# Patient Record
Sex: Male | Born: 1970 | Race: White | Hispanic: Yes | Marital: Married | Smoking: Former smoker
Health system: Southern US, Community
[De-identification: ages and names within clinical notes are randomized; demographics above are authoritative.]

## PROBLEM LIST (undated history)

## (undated) DIAGNOSIS — M069 Rheumatoid arthritis, unspecified: Secondary | ICD-10-CM

## (undated) DIAGNOSIS — L409 Psoriasis, unspecified: Secondary | ICD-10-CM

## (undated) HISTORY — PX: SINUS EXPLORATION: SHX5214

---

## 2013-05-24 ENCOUNTER — Emergency Department (HOSPITAL_COMMUNITY): Payer: PRIVATE HEALTH INSURANCE

## 2013-05-24 ENCOUNTER — Inpatient Hospital Stay (HOSPITAL_COMMUNITY)
Admission: EM | Admit: 2013-05-24 | Discharge: 2013-06-04 | DRG: 336 | Disposition: A | Discharge status: Home / Self Care | Payer: PRIVATE HEALTH INSURANCE | Attending: General Surgery | Admitting: General Surgery

## 2013-05-24 ENCOUNTER — Encounter (HOSPITAL_COMMUNITY): Payer: Self-pay | Admitting: Emergency Medicine

## 2013-05-24 ENCOUNTER — Observation Stay (HOSPITAL_COMMUNITY): Payer: PRIVATE HEALTH INSURANCE

## 2013-05-24 DIAGNOSIS — K560 Paralytic ileus: Secondary | ICD-10-CM | POA: Diagnosis not present

## 2013-05-24 DIAGNOSIS — E876 Hypokalemia: Secondary | ICD-10-CM | POA: Diagnosis not present

## 2013-05-24 DIAGNOSIS — R143 Flatulence: Secondary | ICD-10-CM

## 2013-05-24 DIAGNOSIS — R110 Nausea: Secondary | ICD-10-CM

## 2013-05-24 DIAGNOSIS — K56609 Unspecified intestinal obstruction, unspecified as to partial versus complete obstruction: Secondary | ICD-10-CM

## 2013-05-24 DIAGNOSIS — Y849 Medical procedure, unspecified as the cause of abnormal reaction of the patient, or of later complication, without mention of misadventure at the time of the procedure: Secondary | ICD-10-CM | POA: Diagnosis not present

## 2013-05-24 DIAGNOSIS — S301XXA Contusion of abdominal wall, initial encounter: Secondary | ICD-10-CM | POA: Diagnosis not present

## 2013-05-24 DIAGNOSIS — R109 Unspecified abdominal pain: Secondary | ICD-10-CM

## 2013-05-24 DIAGNOSIS — R142 Eructation: Secondary | ICD-10-CM | POA: Diagnosis not present

## 2013-05-24 DIAGNOSIS — K565 Intestinal adhesions [bands], unspecified as to partial versus complete obstruction: Principal | ICD-10-CM | POA: Diagnosis present

## 2013-05-24 DIAGNOSIS — Z833 Family history of diabetes mellitus: Secondary | ICD-10-CM

## 2013-05-24 DIAGNOSIS — R070 Pain in throat: Secondary | ICD-10-CM | POA: Diagnosis not present

## 2013-05-24 DIAGNOSIS — Z87891 Personal history of nicotine dependence: Secondary | ICD-10-CM

## 2013-05-24 DIAGNOSIS — L408 Other psoriasis: Secondary | ICD-10-CM | POA: Diagnosis present

## 2013-05-24 DIAGNOSIS — R141 Gas pain: Secondary | ICD-10-CM | POA: Diagnosis not present

## 2013-05-24 DIAGNOSIS — K929 Disease of digestive system, unspecified: Secondary | ICD-10-CM | POA: Diagnosis not present

## 2013-05-24 DIAGNOSIS — Z5331 Laparoscopic surgical procedure converted to open procedure: Secondary | ICD-10-CM

## 2013-05-24 DIAGNOSIS — R112 Nausea with vomiting, unspecified: Secondary | ICD-10-CM | POA: Diagnosis not present

## 2013-05-24 DIAGNOSIS — D72829 Elevated white blood cell count, unspecified: Secondary | ICD-10-CM | POA: Diagnosis present

## 2013-05-24 DIAGNOSIS — R197 Diarrhea, unspecified: Secondary | ICD-10-CM | POA: Diagnosis not present

## 2013-05-24 DIAGNOSIS — M069 Rheumatoid arthritis, unspecified: Secondary | ICD-10-CM | POA: Diagnosis present

## 2013-05-24 HISTORY — DX: Psoriasis, unspecified: L40.9

## 2013-05-24 HISTORY — DX: Rheumatoid arthritis, unspecified: M06.9

## 2013-05-24 LAB — URINALYSIS, ROUTINE W REFLEX MICROSCOPIC
Bilirubin Urine: NEGATIVE
Glucose, UA: NEGATIVE mg/dL
Hgb urine dipstick: NEGATIVE
Ketones, ur: 15 mg/dL — AB
Leukocytes, UA: NEGATIVE
NITRITE: NEGATIVE
Protein, ur: NEGATIVE mg/dL
SPECIFIC GRAVITY, URINE: 1.029 (ref 1.005–1.030)
Urobilinogen, UA: 0.2 mg/dL (ref 0.0–1.0)
pH: 5 (ref 5.0–8.0)

## 2013-05-24 LAB — COMPREHENSIVE METABOLIC PANEL
ALT: 18 U/L (ref 0–53)
AST: 17 U/L (ref 0–37)
Albumin: 4.8 g/dL (ref 3.5–5.2)
Alkaline Phosphatase: 53 U/L (ref 39–117)
BUN: 17 mg/dL (ref 6–23)
CALCIUM: 10.2 mg/dL (ref 8.4–10.5)
CO2: 21 mEq/L (ref 19–32)
CREATININE: 0.7 mg/dL (ref 0.50–1.35)
Chloride: 99 mEq/L (ref 96–112)
GFR calc Af Amer: >90 mL/min (ref >90)
GLUCOSE: 157 mg/dL — AB (ref 70–99)
Potassium: 4.2 mEq/L (ref 3.7–5.3)
SODIUM: 138 meq/L (ref 137–147)
TOTAL PROTEIN: 8.2 g/dL (ref 6.0–8.3)
Total Bilirubin: 0.8 mg/dL (ref 0.3–1.2)

## 2013-05-24 LAB — CBC WITH DIFFERENTIAL/PLATELET
Basophils Absolute: 0 10*3/uL (ref 0.0–0.1)
Basophils Relative: 0 % (ref 0–1)
EOS ABS: 0 10*3/uL (ref 0.0–0.7)
EOS PCT: 0 % (ref 0–5)
HEMATOCRIT: 44 % (ref 39.0–52.0)
Hemoglobin: 16.3 g/dL (ref 13.0–17.0)
LYMPHS ABS: 2.7 10*3/uL (ref 0.7–4.0)
Lymphocytes Relative: 19 % (ref 12–46)
MCH: 31.1 pg (ref 26.0–34.0)
MCHC: 37 g/dL — ABNORMAL HIGH (ref 30.0–36.0)
MCV: 84 fL (ref 78.0–100.0)
MONO ABS: 0.9 10*3/uL (ref 0.1–1.0)
Monocytes Relative: 6 % (ref 3–12)
Neutro Abs: 10.8 10*3/uL — ABNORMAL HIGH (ref 1.7–7.7)
Neutrophils Relative %: 75 % (ref 43–77)
PLATELETS: 219 10*3/uL (ref 150–400)
RBC: 5.24 MIL/uL (ref 4.22–5.81)
RDW: 12.8 % (ref 11.5–15.5)
WBC: 14.3 10*3/uL — ABNORMAL HIGH (ref 4.0–10.5)

## 2013-05-24 LAB — LIPASE, BLOOD: Lipase: 33 U/L (ref 11–59)

## 2013-05-24 MED ORDER — BIOTENE DRY MOUTH MT LIQD
15.0000 mL | Freq: Two times a day (BID) | OROMUCOSAL | Status: DC
  Administered 2013-05-25 – 2013-05-27 (×3): 15 mL via OROMUCOSAL

## 2013-05-24 MED ORDER — HYDROMORPHONE HCL PF 1 MG/ML IJ SOLN
0.5000 mg | INTRAMUSCULAR | Status: DC | PRN
  Administered 2013-05-24 – 2013-05-25 (×6): 1 mg via INTRAVENOUS
  Filled 2013-05-24 (×6): qty 1

## 2013-05-24 MED ORDER — MORPHINE SULFATE 2 MG/ML IJ SOLN
2.0000 mg | INTRAMUSCULAR | Status: DC | PRN
  Administered 2013-05-24: 2 mg via INTRAVENOUS
  Filled 2013-05-24: qty 1

## 2013-05-24 MED ORDER — DIPHENHYDRAMINE HCL 12.5 MG/5ML PO ELIX: 12.5000 mg | ORAL_SOLUTION | Freq: Four times a day (QID) | ORAL | Status: DC | PRN

## 2013-05-24 MED ORDER — MORPHINE SULFATE 4 MG/ML IJ SOLN
4.0000 mg | Freq: Once | INTRAMUSCULAR | Status: AC
  Administered 2013-05-24: 4 mg via INTRAVENOUS
  Filled 2013-05-24: qty 1

## 2013-05-24 MED ORDER — SODIUM CHLORIDE 0.9 % IV BOLUS (SEPSIS)
1000.0000 mL | Freq: Once | INTRAVENOUS | Status: AC
  Administered 2013-05-24: 1000 mL via INTRAVENOUS

## 2013-05-24 MED ORDER — ONDANSETRON HCL 4 MG/2ML IJ SOLN
4.0000 mg | Freq: Four times a day (QID) | INTRAMUSCULAR | Status: DC | PRN
  Administered 2013-05-25 – 2013-05-31 (×8): 4 mg via INTRAVENOUS
  Filled 2013-05-24 (×7): qty 2

## 2013-05-24 MED ORDER — LIDOCAINE HCL 2 % EX GEL
Freq: Once | CUTANEOUS | Status: AC
  Administered 2013-05-24: 20
  Filled 2013-05-24: qty 20

## 2013-05-24 MED ORDER — IOHEXOL 300 MG/ML  SOLN
80.0000 mL | Freq: Once | INTRAMUSCULAR | Status: AC | PRN
  Administered 2013-05-24: 80 mL via INTRAVENOUS

## 2013-05-24 MED ORDER — CHLORHEXIDINE GLUCONATE 0.12 % MT SOLN
15.0000 mL | Freq: Two times a day (BID) | OROMUCOSAL | Status: DC
  Administered 2013-05-24 – 2013-05-28 (×9): 15 mL via OROMUCOSAL
  Filled 2013-05-24 (×9): qty 15

## 2013-05-24 MED ORDER — IOHEXOL 300 MG/ML  SOLN
25.0000 mL | Freq: Once | INTRAMUSCULAR | Status: AC | PRN
  Administered 2013-05-24: 25 mL via ORAL

## 2013-05-24 MED ORDER — HYDROMORPHONE HCL PF 1 MG/ML IJ SOLN
INTRAMUSCULAR | Status: AC
  Filled 2013-05-24: qty 1

## 2013-05-24 MED ORDER — MORPHINE SULFATE 2 MG/ML IJ SOLN: 1.0000 mg | INTRAMUSCULAR | Status: DC | PRN

## 2013-05-24 MED ORDER — MORPHINE SULFATE 2 MG/ML IJ SOLN
1.0000 mg | INTRAMUSCULAR | Status: DC | PRN
  Administered 2013-05-24: 4 mg via INTRAVENOUS
  Filled 2013-05-24: qty 2

## 2013-05-24 MED ORDER — ONDANSETRON HCL 4 MG/2ML IJ SOLN
4.0000 mg | Freq: Once | INTRAMUSCULAR | Status: AC
  Administered 2013-05-24: 4 mg via INTRAVENOUS
  Filled 2013-05-24: qty 2

## 2013-05-24 MED ORDER — ENOXAPARIN SODIUM 40 MG/0.4ML ~~LOC~~ SOLN
40.0000 mg | SUBCUTANEOUS | Status: DC
  Administered 2013-05-24 – 2013-06-03 (×11): 40 mg via SUBCUTANEOUS
  Filled 2013-05-24 (×16): qty 0.4

## 2013-05-24 MED ORDER — DIPHENHYDRAMINE HCL 50 MG/ML IJ SOLN: 12.5000 mg | Freq: Four times a day (QID) | INTRAMUSCULAR | Status: DC | PRN

## 2013-05-24 MED ORDER — KCL IN DEXTROSE-NACL 20-5-0.9 MEQ/L-%-% IV SOLN
INTRAVENOUS | Status: DC
  Administered 2013-05-24: 21:00:00 via INTRAVENOUS
  Administered 2013-05-24: 125 mL/h via INTRAVENOUS
  Administered 2013-05-25 – 2013-06-02 (×16): via INTRAVENOUS
  Filled 2013-05-24 (×34): qty 1000

## 2013-05-24 NOTE — ED Notes (Signed)
Pt more comfortable, HR and RR improved after voiding, Meghan, PA witg CCS updated, pt's NG tube backed out 2 cm per PA

## 2013-05-24 NOTE — H&P (Signed)
Nontender but no flatus yet. Pt lives in [place]. He asked about going back there if he needs surgery. I told him we will see how his films look in the AM. Patient examined and I agree with the assessment and plan  Violeta Gelinas, MD, MPH, FACS Trauma: [phone] General Surgery: [phone]  05/24/2013 5:12 PM

## 2013-05-24 NOTE — ED Notes (Signed)
Pt in increased pain, diaphoretic, elevated HR and resp rate, surgery re-paged to update

## 2013-05-24 NOTE — ED Notes (Signed)
Delay in pt being seen by physician ie: pt came to ED for evaluation for lower abdominal pain. While waiting to be seen, pain improved, he decided not to be seen and fell asleep in the chair in the ED.  He was suddenly awakened by severe lower abdominal pain, changed his mind and asked to be seen after all.  Pt reports he ate some spicy meat last evening, drank a beer, went back to his hotel and experienced lower abdominal pain.  Denies vomiting although he states he tried to do so; has had 3 episodes of diarrhea, last being approx 30 min ago.

## 2013-05-24 NOTE — ED Notes (Signed)
Pt. did not leave ER decided to be examined by EDP .

## 2013-05-24 NOTE — H&P (Signed)
Chief Complaint: abdominal pain  HPI: Jerome Sloan is a 43 year old male with a history of psoriasis who presents to [facility] with abdominal pain.  Duration of symptoms; since last night.  He reports having a steak with lots of seasoning and feeling full.  He went up to his hotel room to rest, but the pain persisted.  Modifying factors include; tums without any relief.  Aggravating factors; walking.  Alleviating factors; curled up in fetal position or bending forward.  Location of pain is in the epigastric region and without radiation.  Characterized as sharp and cramping pain.  Moderate to severe in severity.  Associated with nausea, no vomiting.  He reports coming to the ED and feeling better, even falling asleep only for the pain to return and at that time it had worsened.  He denies previous symptoms.  He endorses to traveling on weekly basis.  He reports being in [place] last week, did not have any GI symptoms.  He reports having a bowel movement overnight in the ED.  No change in bowel pattern.  Denies fever or chills.  He denies previous surgeries. AXR revealed a small bowel obstruction.  He has an NGT in place, no output thus far.  He reports that overall he feels better.  His electrolytes are, renal function and hepatic function is normal. He has a white count of 14.3K.  He is afebrile.  His vital signs are stable.    Past Medical History  Diagnosis Date  . Psoriasis     History reviewed. No pertinent past surgical history.  Family History  Problem Relation Age of Onset  . Alcohol abuse Father   . Diabetes Father    Social History:  reports that he has never smoked. He does not have any smokeless tobacco history on file. He reports that he drinks alcohol. He reports that he does not use illicit drugs.  Allergies: No Known Allergies  Medication Humira 68m SQ once weekly--due on Sunday ambien 58mQHS  (Not in a hospital admission)  Results for orders placed during the hospital  encounter of 05/24/13 (from the past 48 hour(s))  CBC WITH DIFFERENTIAL     Status: Abnormal   Collection Time    05/24/13  6:26 AM      Result Value Ref Range   WBC 14.3 (*) 4.0 - 10.5 K/uL   RBC 5.24  4.22 - 5.81 MIL/uL   Hemoglobin 16.3  13.0 - 17.0 g/dL   HCT 44.0  39.0 - 52.0 %   MCV 84.0  78.0 - 100.0 fL   MCH 31.1  26.0 - 34.0 pg   MCHC 37.0 (*) 30.0 - 36.0 g/dL   RDW 12.8  11.5 - 15.5 %   Platelets 219  150 - 400 K/uL   Neutrophils Relative % 75  43 - 77 %   Neutro Abs 10.8 (*) 1.7 - 7.7 K/uL   Lymphocytes Relative 19  12 - 46 %   Lymphs Abs 2.7  0.7 - 4.0 K/uL   Monocytes Relative 6  3 - 12 %   Monocytes Absolute 0.9  0.1 - 1.0 K/uL   Eosinophils Relative 0  0 - 5 %   Eosinophils Absolute 0.0  0.0 - 0.7 K/uL   Basophils Relative 0  0 - 1 %   Basophils Absolute 0.0  0.0 - 0.1 K/uL  COMPREHENSIVE METABOLIC PANEL     Status: Abnormal   Collection Time    05/24/13  6:26 AM  Result Value Ref Range   Sodium 138  137 - 147 mEq/L   Potassium 4.2  3.7 - 5.3 mEq/L   Chloride 99  96 - 112 mEq/L   CO2 21  19 - 32 mEq/L   Glucose, Bld 157 (*) 70 - 99 mg/dL   BUN 17  6 - 23 mg/dL   Creatinine, Ser 0.70  0.50 - 1.35 mg/dL   Calcium 10.2  8.4 - 10.5 mg/dL   Total Protein 8.2  6.0 - 8.3 g/dL   Albumin 4.8  3.5 - 5.2 g/dL   AST 17  0 - 37 U/L   ALT 18  0 - 53 U/L   Alkaline Phosphatase 53  39 - 117 U/L   Total Bilirubin 0.8  0.3 - 1.2 mg/dL   GFR calc non Af Amer >90  >90 mL/min   GFR calc Af Amer >90  >90 mL/min   Comment: (NOTE)     The eGFR has been calculated using the CKD EPI equation.     This calculation has not been validated in all clinical situations.     eGFR's persistently <90 mL/min signify possible Chronic Kidney     Disease.  LIPASE, BLOOD     Status: None   Collection Time    05/24/13  6:26 AM      Result Value Ref Range   Lipase 33  11 - 59 U/L   Dg Abd Acute W/chest  05/24/2013   CLINICAL DATA:  Mid abdominal pain.  EXAM: ACUTE ABDOMEN SERIES  (ABDOMEN 2 VIEW & CHEST 1 VIEW)  COMPARISON:  None.  FINDINGS: Low lung volumes. No infiltrates or nodules. Normal cardiomediastinal silhouette. Negative osseous structures. No free air.  Dilated loops of small bowel with air-fluid levels consistent with small bowel obstruction. Paucity of colonic gas. No calcifications or osseous abnormalities.  IMPRESSION: Negative chest radiograph.  Small bowel obstruction.   Electronically Signed   By: Rolla Flatten M.D.   On: 05/24/2013 07:28    Review of Systems  All other systems reviewed and are negative.    Blood pressure 146/93, pulse 78, temperature 98 F (36.7 C), temperature source Oral, resp. rate 14, height _0  (1.803 m), weight 185 lb (83.915 kg), SpO2 98.00%. Physical Exam  Constitutional: He is oriented to person, place, and time. He appears well-developed and well-nourished. No distress.  Eyes: Conjunctivae and EOM are normal. Pupils are equal, round, and reactive to light. No scleral icterus.  Neck: Normal range of motion. Neck supple.  Cardiovascular: Normal rate, regular rhythm, normal heart sounds and intact distal pulses.  Exam reveals no gallop and no friction rub.   No murmur heard. Respiratory: Effort normal and breath sounds normal. No respiratory distress. He has no wheezes. He has no rales. He exhibits no tenderness.  GI: Soft. Bowel sounds are normal. He exhibits distension. He exhibits no mass. There is no rebound and no guarding.  TTP to lower abdomen without guarding  Musculoskeletal: He exhibits no edema and no tenderness.  Lymphadenopathy:    He has no cervical adenopathy.  Neurological: He is alert and oriented to person, place, and time.  Skin: Skin is warm and dry. No rash noted. He is not diaphoretic. No pallor.  Psychiatric: He has a normal mood and affect. His behavior is normal. Judgment and thought content normal.     Assessment/Plan Abdominal pain, nausea Small bowel obstruction on AXR  I was unable to  appreciate a mass or hernia on exam.  He does not have any previous abdominal surgeries and therefore the risk of adhesions is low.  He does not have an acute abdomen that warrants immediate surgical intervention.  Will proceed with a CT of abdomen and pelvis to better evaluate the etiology of abdomen pain.   NGT to LWIS NPO x ice chips Pain control Antiemetics SCDs, lovenox   Donnalyn Juran ANP-BC Pager [phone] 05/24/2013, 9:27 AM

## 2013-05-24 NOTE — ED Notes (Signed)
Admitting MD at bedside.

## 2013-05-24 NOTE — ED Notes (Signed)
Pt. reports mid abdominal pain after eating supper last night with nausea .

## 2013-05-24 NOTE — ED Provider Notes (Signed)
CSN: [id number]     Arrival date & time 05/24/13  0407 History   First MD Initiated Contact with Patient 05/24/13 [phone]     Chief Complaint  Patient presents with  . Abdominal Pain     (Consider location/radiation/quality/duration/timing/severity/associated sxs/prior Treatment) The history is provided by the patient and medical records.   This is a 43 year old male with past medical history significant for psoriasis, presenting to the ED for mid and lower abdominal pain after eating dinner last night. Patient states he ate steak, which is not unusual for him, however this time he had a prepared with blackened seasonings which is new. He endorses some associated nausea but denies vomiting.  No fevers or chills.  No diarrhea. Patient does not have bowel movements are usually regular, but have been altered lately due to his travel scheduled.  He is currently in [place] visiting from [place], [place] and is scheduled to return home on Friday.  No prior abdominal surgeries.  He has taken tums and motrin without significant improvement of symptoms.  VS stable on arrival.  Past Medical History  Diagnosis Date  . Psoriasis    History reviewed. No pertinent past surgical history. No family history on file. History  Substance Use Topics  . Smoking status: Never Smoker   . Smokeless tobacco: Not on file  . Alcohol Use: Yes    Review of Systems  Gastrointestinal: Positive for nausea and abdominal pain.  All other systems reviewed and are negative.      Allergies  Review of patient's allergies indicates no known allergies.  Home Medications   Current Outpatient Rx  Name  Route  Sig  Dispense  Refill  . adalimumab (HUMIRA) 40 MG/0.8ML injection   Subcutaneous   Inject 40 mg into the skin once a week. sunday         . calcipotriene-betamethasone (TACLONEX) ointment   Topical   Apply 1 application topically daily.         . calcium carbonate (TUMS - DOSED IN MG ELEMENTAL  CALCIUM) 500 MG chewable tablet   Oral   Chew 3 tablets by mouth once.         Marland Kitchen ibuprofen (ADVIL,MOTRIN) 200 MG tablet   Oral   Take 600 mg by mouth every 6 (six) hours as needed for headache or mild pain.         Marland Kitchen zolpidem (AMBIEN) 5 MG tablet   Oral   Take 5 mg by mouth at bedtime.          BP 129/92  Pulse 82  Temp(Src) 98 F (36.7 C) (Oral)  Resp 14  Ht 5\' 11"  (1.803 m)  Wt 185 lb (83.915 kg)  BMI 25.81 kg/m2  SpO2 97%  Physical Exam  Nursing note and vitals reviewed. Constitutional: He is oriented to person, place, and time. He appears well-developed and well-nourished. No distress.  Appears uncomfortable  HENT:  Head: Normocephalic and atraumatic.  Mouth/Throat: Oropharynx is clear and moist.  Eyes: Conjunctivae and EOM are normal. Pupils are equal, round, and reactive to light.  Neck: Normal range of motion. Neck supple.  Cardiovascular: Normal rate, regular rhythm and normal heart sounds.   Pulmonary/Chest: Effort normal and breath sounds normal. No respiratory distress. He has no wheezes.  Abdominal: Soft. Bowel sounds are normal. There is tenderness in the right lower quadrant, periumbilical area and left lower quadrant. There is no rigidity, no guarding and no CVA tenderness.  Abdomen soft, non-distended, TTP periumbilical region,  LLQ, and RLQ without rebound or gurading  Musculoskeletal: Normal range of motion. He exhibits no edema.  Neurological: He is alert and oriented to person, place, and time.  Skin: Skin is warm and dry. He is not diaphoretic.  Psychiatric: He has a normal mood and affect.    ED Course  Procedures (including critical care time) Labs Review Labs Reviewed  CBC WITH DIFFERENTIAL - Abnormal; Notable for the following:    WBC 14.3 (*)    MCHC 37.0 (*)    Neutro Abs 10.8 (*)    All other components within normal limits  COMPREHENSIVE METABOLIC PANEL - Abnormal; Notable for the following:    Glucose, Bld 157 (*)    All other  components within normal limits  LIPASE, BLOOD  URINALYSIS, ROUTINE W REFLEX MICROSCOPIC   Imaging Review Dg Abd Acute W/chest  05/24/2013   CLINICAL DATA:  Mid abdominal pain.  EXAM: ACUTE ABDOMEN SERIES (ABDOMEN 2 VIEW & CHEST 1 VIEW)  COMPARISON:  None.  FINDINGS: Low lung volumes. No infiltrates or nodules. Normal cardiomediastinal silhouette. Negative osseous structures. No free air.  Dilated loops of small bowel with air-fluid levels consistent with small bowel obstruction. Paucity of colonic gas. No calcifications or osseous abnormalities.  IMPRESSION: Negative chest radiograph.  Small bowel obstruction.   Electronically Signed   By: Davonna Belling M.D.   On: 05/24/2013 07:28     EKG Interpretation None      MDM   Final diagnoses:  Small bowel obstruction   Labs with leukocytosis of 14.3. Acute abdominal series revealing small bowel obstruction. NG-tube placed. Consulted general surgery, Dr. Janee Morn who will evaluate patient in the ED and likely admit.  Garlon Hatchet, PA-C 05/24/13 1219

## 2013-05-25 ENCOUNTER — Observation Stay (HOSPITAL_COMMUNITY): Payer: PRIVATE HEALTH INSURANCE

## 2013-05-25 LAB — BASIC METABOLIC PANEL
BUN: 12 mg/dL (ref 6–23)
CO2: 25 mEq/L (ref 19–32)
Calcium: 9.2 mg/dL (ref 8.4–10.5)
Chloride: 100 mEq/L (ref 96–112)
Creatinine, Ser: 0.71 mg/dL (ref 0.50–1.35)
GFR calc non Af Amer: >90 mL/min (ref >90)
Glucose, Bld: 139 mg/dL — ABNORMAL HIGH (ref 70–99)
POTASSIUM: 4 meq/L (ref 3.7–5.3)
SODIUM: 137 meq/L (ref 137–147)

## 2013-05-25 LAB — CBC
HCT: 40.9 % (ref 39.0–52.0)
HEMOGLOBIN: 14.9 g/dL (ref 13.0–17.0)
MCH: 31.4 pg (ref 26.0–34.0)
MCHC: 36.4 g/dL — ABNORMAL HIGH (ref 30.0–36.0)
MCV: 86.3 fL (ref 78.0–100.0)
PLATELETS: 199 10*3/uL (ref 150–400)
RBC: 4.74 MIL/uL (ref 4.22–5.81)
RDW: 13.1 % (ref 11.5–15.5)
WBC: 11.3 10*3/uL — ABNORMAL HIGH (ref 4.0–10.5)

## 2013-05-25 MED ORDER — HYDROMORPHONE HCL PF 1 MG/ML IJ SOLN
1.0000 mg | Freq: Once | INTRAMUSCULAR | Status: AC
  Administered 2013-05-25: 1 mg via INTRAVENOUS

## 2013-05-25 MED ORDER — LORAZEPAM BOLUS VIA INFUSION
0.5000 mg | Freq: Three times a day (TID) | INTRAVENOUS | Status: DC | PRN
  Filled 2013-05-25: qty 1

## 2013-05-25 MED ORDER — LORAZEPAM 2 MG/ML IJ SOLN
0.5000 mg | Freq: Four times a day (QID) | INTRAMUSCULAR | Status: DC | PRN
  Administered 2013-05-25 – 2013-05-30 (×8): 1 mg via INTRAVENOUS
  Filled 2013-05-25 (×9): qty 1

## 2013-05-25 MED ORDER — HYDROMORPHONE HCL PF 1 MG/ML IJ SOLN
1.0000 mg | INTRAMUSCULAR | Status: DC | PRN
  Administered 2013-05-25 – 2013-05-28 (×17): 1 mg via INTRAVENOUS
  Filled 2013-05-25 (×17): qty 1

## 2013-05-25 NOTE — Progress Notes (Signed)
No real improvement. Has had some pain but abdomen is soft and NT. NGT repositioned & irrigated and some lettuce is coming back.If no improvement tomorrow, will likely need surgery. His wife is flying in tonight.  Patient examined and I agree with the assessment and plan  Violeta Gelinas, MD, MPH, FACS Trauma: [phone] General Surgery: [phone]  05/25/2013 2:23 PM

## 2013-05-25 NOTE — Progress Notes (Signed)
UR completed. Patient changed to inpatient- requiring IVF @ 125cc/hr and IV pain medication 

## 2013-05-25 NOTE — Progress Notes (Signed)
Patient ID: Jerome Sloan, male   DOB: [birth year], 43 y.o.   MRN: [id number]  Subjective: Had pain earlier, cramping type, intermittent.  No n/v.  No flatus or BM.  No output recorded, but NG cannister with 500 of bilious output.    Objective:  Vital signs:  Filed Vitals:   05/24/13 1820 05/24/13 2111 05/25/13 0201 05/25/13 0619  BP: 135/96 144/103 145/94 141/99  Pulse: 74 80 81 85  Temp: 98.5 F (36.9 C) 97.8 F (36.6 C) 98.4 F (36.9 C) 97.5 F (36.4 C)  TempSrc: Oral Oral Oral Oral  Resp: $Remo'18 18 18 18  'WdosU$ Height:      Weight:      SpO2: 100% 96% 96% 83%    Last BM Date: 05/24/13  Intake/Output   Yesterday:  03/25 0701 - 03/26 0700 In: -  Out: 925 [Urine:925] This shift:    I/O last 3 completed shifts: In: 1600 [I.V.:1250; NG/GT:350] Out: 925 [Urine:925]   Physical Exam: General: Pt awake/alert/oriented x3 in no acute distress Chest: cta. No chest wall pain w good excursion CV:  Pulses intact.  Regular rhythm MS: Normal AROM mjr joints.  No obvious deformity Abdomen: Soft.  Nondistended. Minimal ttp to rlq.  No evidence of peritonitis.  No incarcerated hernias. Ext:  SCDs BLE.  No mjr edema.  No cyanosis Skin: No petechiae / purpura   Problem List:   Active Problems:   Abdominal pain    Results:   Labs: Results for orders placed during the hospital encounter of 05/24/13 (from the past 48 hour(s))  CBC WITH DIFFERENTIAL     Status: Abnormal   Collection Time    05/24/13  6:26 AM      Result Value Ref Range   WBC 14.3 (*) 4.0 - 10.5 K/uL   RBC 5.24  4.22 - 5.81 MIL/uL   Hemoglobin 16.3  13.0 - 17.0 g/dL   HCT 44.0  39.0 - 52.0 %   MCV 84.0  78.0 - 100.0 fL   MCH 31.1  26.0 - 34.0 pg   MCHC 37.0 (*) 30.0 - 36.0 g/dL   RDW 12.8  11.5 - 15.5 %   Platelets 219  150 - 400 K/uL   Neutrophils Relative % 75  43 - 77 %   Neutro Abs 10.8 (*) 1.7 - 7.7 K/uL   Lymphocytes Relative 19  12 - 46 %   Lymphs Abs 2.7  0.7 - 4.0 K/uL   Monocytes Relative 6  3 -  12 %   Monocytes Absolute 0.9  0.1 - 1.0 K/uL   Eosinophils Relative 0  0 - 5 %   Eosinophils Absolute 0.0  0.0 - 0.7 K/uL   Basophils Relative 0  0 - 1 %   Basophils Absolute 0.0  0.0 - 0.1 K/uL  COMPREHENSIVE METABOLIC PANEL     Status: Abnormal   Collection Time    05/24/13  6:26 AM      Result Value Ref Range   Sodium 138  137 - 147 mEq/L   Potassium 4.2  3.7 - 5.3 mEq/L   Chloride 99  96 - 112 mEq/L   CO2 21  19 - 32 mEq/L   Glucose, Bld 157 (*) 70 - 99 mg/dL   BUN 17  6 - 23 mg/dL   Creatinine, Ser 0.70  0.50 - 1.35 mg/dL   Calcium 10.2  8.4 - 10.5 mg/dL   Total Protein 8.2  6.0 - 8.3 g/dL   Albumin 4.8  3.5 - 5.2 g/dL   AST 17  0 - 37 U/L   ALT 18  0 - 53 U/L   Alkaline Phosphatase 53  39 - 117 U/L   Total Bilirubin 0.8  0.3 - 1.2 mg/dL   GFR calc non Af Amer >90  >90 mL/min   GFR calc Af Amer >90  >90 mL/min   Comment: (NOTE)     The eGFR has been calculated using the CKD EPI equation.     This calculation has not been validated in all clinical situations.     eGFR's persistently <90 mL/min signify possible Chronic Kidney     Disease.  LIPASE, BLOOD     Status: None   Collection Time    05/24/13  6:26 AM      Result Value Ref Range   Lipase 33  11 - 59 U/L  URINALYSIS, ROUTINE W REFLEX MICROSCOPIC     Status: Abnormal   Collection Time    05/24/13  9:58 AM      Result Value Ref Range   Color, Urine YELLOW  YELLOW   APPearance CLEAR  CLEAR   Specific Gravity, Urine 1.029  1.005 - 1.030   pH 5.0  5.0 - 8.0   Glucose, UA NEGATIVE  NEGATIVE mg/dL   Hgb urine dipstick NEGATIVE  NEGATIVE   Bilirubin Urine NEGATIVE  NEGATIVE   Ketones, ur 15 (*) NEGATIVE mg/dL   Protein, ur NEGATIVE  NEGATIVE mg/dL   Urobilinogen, UA 0.2  0.0 - 1.0 mg/dL   Nitrite NEGATIVE  NEGATIVE   Leukocytes, UA NEGATIVE  NEGATIVE   Comment: MICROSCOPIC NOT DONE ON URINES WITH NEGATIVE PROTEIN, BLOOD, LEUKOCYTES, NITRITE, OR GLUCOSE <1000 mg/dL.  CBC     Status: Abnormal   Collection Time     05/25/13  5:52 AM      Result Value Ref Range   WBC 11.3 (*) 4.0 - 10.5 K/uL   RBC 4.74  4.22 - 5.81 MIL/uL   Hemoglobin 14.9  13.0 - 17.0 g/dL   HCT 40.9  39.0 - 52.0 %   MCV 86.3  78.0 - 100.0 fL   MCH 31.4  26.0 - 34.0 pg   MCHC 36.4 (*) 30.0 - 36.0 g/dL   RDW 13.1  11.5 - 15.5 %   Platelets 199  150 - 400 K/uL  BASIC METABOLIC PANEL     Status: Abnormal   Collection Time    05/25/13  5:52 AM      Result Value Ref Range   Sodium 137  137 - 147 mEq/L   Potassium 4.0  3.7 - 5.3 mEq/L   Chloride 100  96 - 112 mEq/L   CO2 25  19 - 32 mEq/L   Glucose, Bld 139 (*) 70 - 99 mg/dL   BUN 12  6 - 23 mg/dL   Creatinine, Ser 0.71  0.50 - 1.35 mg/dL   Calcium 9.2  8.4 - 10.5 mg/dL   GFR calc non Af Amer >90  >90 mL/min   GFR calc Af Amer >90  >90 mL/min   Comment: (NOTE)     The eGFR has been calculated using the CKD EPI equation.     This calculation has not been validated in all clinical situations.     eGFR's persistently <90 mL/min signify possible Chronic Kidney     Disease.    Imaging / Studies: Ct Abdomen Pelvis W Contrast  05/24/2013   CLINICAL DATA:  Pain.  EXAM: CT ABDOMEN  AND PELVIS WITH CONTRAST  TECHNIQUE: Multidetector CT imaging of the abdomen and pelvis was performed using the standard protocol following bolus administration of intravenous contrast.  CONTRAST:  32mL OMNIPAQUE IOHEXOL 300 MG/ML  SOLN  COMPARISON:  DG ABD ACUTE W/CHEST dated 05/24/2013  FINDINGS: Punctate lucency is seen in the right hepatic lobe consistent with tiny cyst. Liver otherwise normal. Spleen normal. Splenosis. Pancreas is normal. No biliary distention. Gallbladder is nondistended.  Adrenals normal. Tiny angiomyolipoma right kidney. Pelvic kidney on the left. No hydronephrosis or evidence of obstructing ureteral stone. The bladder is nondistended.  No significant adenopathy. Abdominal aorta normal in caliber. Visceral vessels are patent. Portal vein patent.  The appendix is not definitely identified,  but what appears to be the appendix is normal. The colon is nondistended. Multiple distended loops of small bowel are noted. Loops are distended to the level of the pelvis. Mesenteric edema is present. These findings are consistent with small bowel obstruction. The etiology of small bowel obstruction is not determined. NG tube is noted coiled in the stomach. There is mild gastric distention. No free air. No hernia.  Lung bases clear.  No acute bony abnormality.  Heart size normal.  IMPRESSION: 1. Findings most consistent with small bowel obstruction at the level of the mid pelvis. Etiology of the obstruction is unclear. 2. NG tube noted coiled in stomach. Debris and fluid noted in the stomach. 3. Left pelvic kidney.   Electronically Signed   By: Marcello Moores  Register   On: 05/24/2013 11:40   Dg Abd Acute W/chest  05/24/2013   CLINICAL DATA:  Mid abdominal pain.  EXAM: ACUTE ABDOMEN SERIES (ABDOMEN 2 VIEW & CHEST 1 VIEW)  COMPARISON:  None.  FINDINGS: Low lung volumes. No infiltrates or nodules. Normal cardiomediastinal silhouette. Negative osseous structures. No free air.  Dilated loops of small bowel with air-fluid levels consistent with small bowel obstruction. Paucity of colonic gas. No calcifications or osseous abnormalities.  IMPRESSION: Negative chest radiograph.  Small bowel obstruction.   Electronically Signed   By: Rolla Flatten M.D.   On: 05/24/2013 07:28    Medications / Allergies: per chart  Antibiotics: Anti-infectives   None      Assessment/Plan Abdominal pain, nausea  Small bowel obstruction No improvement in AXR this AM, continue with conservative management.  The patient would like to avoid surgery and get back to [facility].  Hopefully he is improve with conservative management.  NGT to LWIS  NPO x ice chips  Pain control  Antiemetics  SCDs, lovenox Repeat films AM    Erby Pian, [facility] Surgery Pager [phone] Office [phone]  05/25/2013 8:27  AM

## 2013-05-26 ENCOUNTER — Encounter (HOSPITAL_COMMUNITY): Payer: PRIVATE HEALTH INSURANCE | Admitting: Certified Registered"

## 2013-05-26 ENCOUNTER — Inpatient Hospital Stay (HOSPITAL_COMMUNITY): Payer: PRIVATE HEALTH INSURANCE

## 2013-05-26 ENCOUNTER — Encounter (HOSPITAL_COMMUNITY): Payer: Self-pay | Admitting: Certified Registered"

## 2013-05-26 ENCOUNTER — Inpatient Hospital Stay (HOSPITAL_COMMUNITY): Payer: PRIVATE HEALTH INSURANCE | Admitting: Certified Registered"

## 2013-05-26 ENCOUNTER — Encounter (HOSPITAL_COMMUNITY): Admission: EM | Disposition: A | Discharge status: Home / Self Care | Payer: Self-pay | Source: Home / Self Care

## 2013-05-26 DIAGNOSIS — K565 Intestinal adhesions [bands], unspecified as to partial versus complete obstruction: Secondary | ICD-10-CM

## 2013-05-26 HISTORY — PX: LAPAROTOMY: SHX154

## 2013-05-26 HISTORY — PX: LAPAROSCOPY: SHX197

## 2013-05-26 LAB — SURGICAL PCR SCREEN
MRSA, PCR: NEGATIVE
Staphylococcus aureus: NEGATIVE

## 2013-05-26 SURGERY — LAPAROSCOPY, DIAGNOSTIC
Anesthesia: General | Site: Abdomen

## 2013-05-26 MED ORDER — OXYCODONE HCL 5 MG PO TABS: 5.0000 mg | ORAL_TABLET | Freq: Once | ORAL | Status: DC | PRN

## 2013-05-26 MED ORDER — LACTATED RINGERS IV SOLN
INTRAVENOUS | Status: DC
  Administered 2013-05-26 (×2): via INTRAVENOUS

## 2013-05-26 MED ORDER — SODIUM CHLORIDE 0.9 % IJ SOLN: 9.0000 mL | INTRAMUSCULAR | Status: DC | PRN

## 2013-05-26 MED ORDER — NEOSTIGMINE METHYLSULFATE 1 MG/ML IJ SOLN
INTRAMUSCULAR | Status: DC | PRN
  Administered 2013-05-26: 4 mg via INTRAVENOUS

## 2013-05-26 MED ORDER — ROCURONIUM BROMIDE 100 MG/10ML IV SOLN
INTRAVENOUS | Status: DC | PRN
  Administered 2013-05-26: 10 mg via INTRAVENOUS
  Administered 2013-05-26: 25 mg via INTRAVENOUS
  Administered 2013-05-26: 15 mg via INTRAVENOUS

## 2013-05-26 MED ORDER — PROPOFOL 10 MG/ML IV BOLUS
INTRAVENOUS | Status: DC | PRN
  Administered 2013-05-26: 40 mg via INTRAVENOUS
  Administered 2013-05-26: 160 mg via INTRAVENOUS

## 2013-05-26 MED ORDER — MIDAZOLAM HCL 5 MG/5ML IJ SOLN
INTRAMUSCULAR | Status: DC | PRN
  Administered 2013-05-26: 2 mg via INTRAVENOUS

## 2013-05-26 MED ORDER — SUFENTANIL CITRATE 50 MCG/ML IV SOLN
INTRAVENOUS | Status: DC | PRN
  Administered 2013-05-26: 15 ug via INTRAVENOUS
  Administered 2013-05-26: 10 ug via INTRAVENOUS
  Administered 2013-05-26: 5 ug via INTRAVENOUS
  Administered 2013-05-26: 10 ug via INTRAVENOUS
  Administered 2013-05-26 (×2): 5 ug via INTRAVENOUS

## 2013-05-26 MED ORDER — PROPOFOL 10 MG/ML IV BOLUS
INTRAVENOUS | Status: AC
  Filled 2013-05-26: qty 20

## 2013-05-26 MED ORDER — HYDROMORPHONE 0.3 MG/ML IV SOLN
INTRAVENOUS | Status: DC
  Administered 2013-05-27: 0.9 mg via INTRAVENOUS
  Administered 2013-05-27: 0.6 mg via INTRAVENOUS
  Administered 2013-05-27: 09:00:00 via INTRAVENOUS
  Administered 2013-05-27: 0.9 mg via INTRAVENOUS
  Administered 2013-05-27: 0.6 mg via INTRAVENOUS
  Filled 2013-05-26: qty 25

## 2013-05-26 MED ORDER — ONDANSETRON HCL 4 MG/2ML IJ SOLN: 4.0000 mg | Freq: Once | INTRAMUSCULAR | Status: DC | PRN

## 2013-05-26 MED ORDER — 0.9 % SODIUM CHLORIDE (POUR BTL) OPTIME
TOPICAL | Status: DC | PRN
  Administered 2013-05-26 (×2): 1000 mL

## 2013-05-26 MED ORDER — ONDANSETRON HCL 4 MG/2ML IJ SOLN
INTRAMUSCULAR | Status: DC | PRN
  Administered 2013-05-26: 4 mg via INTRAVENOUS

## 2013-05-26 MED ORDER — HYDROMORPHONE HCL PF 1 MG/ML IJ SOLN
INTRAMUSCULAR | Status: AC
  Administered 2013-05-26: 0.5 mg via INTRAVENOUS
  Filled 2013-05-26: qty 2

## 2013-05-26 MED ORDER — ROCURONIUM BROMIDE 50 MG/5ML IV SOLN
INTRAVENOUS | Status: AC
  Filled 2013-05-26: qty 1

## 2013-05-26 MED ORDER — LIDOCAINE HCL (CARDIAC) 20 MG/ML IV SOLN
INTRAVENOUS | Status: AC
  Filled 2013-05-26: qty 5

## 2013-05-26 MED ORDER — DIPHENHYDRAMINE HCL 12.5 MG/5ML PO ELIX: 12.5000 mg | ORAL_SOLUTION | Freq: Four times a day (QID) | ORAL | Status: DC | PRN

## 2013-05-26 MED ORDER — SUCCINYLCHOLINE CHLORIDE 20 MG/ML IJ SOLN
INTRAMUSCULAR | Status: AC
  Filled 2013-05-26: qty 1

## 2013-05-26 MED ORDER — GLYCOPYRROLATE 0.2 MG/ML IJ SOLN
INTRAMUSCULAR | Status: AC
  Filled 2013-05-26: qty 3

## 2013-05-26 MED ORDER — ONDANSETRON HCL 4 MG/2ML IJ SOLN
INTRAMUSCULAR | Status: AC
  Filled 2013-05-26: qty 2

## 2013-05-26 MED ORDER — ACETAMINOPHEN 325 MG PO TABS: 325.0000 mg | ORAL_TABLET | ORAL | Status: DC | PRN

## 2013-05-26 MED ORDER — GLYCOPYRROLATE 0.2 MG/ML IJ SOLN
INTRAMUSCULAR | Status: DC | PRN
  Administered 2013-05-26: 0.6 mg via INTRAVENOUS

## 2013-05-26 MED ORDER — NALOXONE HCL 0.4 MG/ML IJ SOLN: 0.4000 mg | INTRAMUSCULAR | Status: DC | PRN

## 2013-05-26 MED ORDER — MIDAZOLAM HCL 2 MG/2ML IJ SOLN
INTRAMUSCULAR | Status: AC
  Filled 2013-05-26: qty 2

## 2013-05-26 MED ORDER — DIPHENHYDRAMINE HCL 50 MG/ML IJ SOLN: 12.5000 mg | Freq: Four times a day (QID) | INTRAMUSCULAR | Status: DC | PRN

## 2013-05-26 MED ORDER — BUPIVACAINE-EPINEPHRINE PF 0.25-1:200000 % IJ SOLN
INTRAMUSCULAR | Status: DC | PRN
  Administered 2013-05-26: 30 mL

## 2013-05-26 MED ORDER — SODIUM CHLORIDE 0.9 % IJ SOLN
INTRAMUSCULAR | Status: AC
  Filled 2013-05-26: qty 10

## 2013-05-26 MED ORDER — ONDANSETRON HCL 4 MG/2ML IJ SOLN
4.0000 mg | Freq: Four times a day (QID) | INTRAMUSCULAR | Status: DC | PRN
Start: 2013-05-26 — End: 2013-05-26

## 2013-05-26 MED ORDER — DEXTROSE 5 % IV SOLN
1.0000 g | Freq: Once | INTRAVENOUS | Status: AC
  Administered 2013-05-26: 1 g via INTRAVENOUS
  Filled 2013-05-26 (×2): qty 1

## 2013-05-26 MED ORDER — SUCCINYLCHOLINE CHLORIDE 20 MG/ML IJ SOLN
INTRAMUSCULAR | Status: DC | PRN
  Administered 2013-05-26: 50 mg via INTRAVENOUS

## 2013-05-26 MED ORDER — ARTIFICIAL TEARS OP OINT
TOPICAL_OINTMENT | OPHTHALMIC | Status: AC
  Filled 2013-05-26: qty 3.5

## 2013-05-26 MED ORDER — BUPIVACAINE-EPINEPHRINE (PF) 0.25% -1:200000 IJ SOLN
INTRAMUSCULAR | Status: AC
  Filled 2013-05-26: qty 30

## 2013-05-26 MED ORDER — HYDROMORPHONE HCL PF 1 MG/ML IJ SOLN
0.2500 mg | INTRAMUSCULAR | Status: DC | PRN
  Administered 2013-05-26 (×4): 0.5 mg via INTRAVENOUS

## 2013-05-26 MED ORDER — LIDOCAINE HCL (CARDIAC) 20 MG/ML IV SOLN
INTRAVENOUS | Status: DC | PRN
  Administered 2013-05-26: 80 mg via INTRAVENOUS

## 2013-05-26 MED ORDER — ACETAMINOPHEN 160 MG/5ML PO SOLN
325.0000 mg | ORAL | Status: DC | PRN
  Filled 2013-05-26: qty 20.3

## 2013-05-26 MED ORDER — SUFENTANIL CITRATE 50 MCG/ML IV SOLN
INTRAVENOUS | Status: AC
  Filled 2013-05-26: qty 1

## 2013-05-26 MED ORDER — ARTIFICIAL TEARS OP OINT
TOPICAL_OINTMENT | OPHTHALMIC | Status: DC | PRN
  Administered 2013-05-26: 1 via OPHTHALMIC

## 2013-05-26 MED ORDER — ALBUMIN HUMAN 5 % IV SOLN
25.0000 g | Freq: Once | INTRAVENOUS | Status: AC
  Administered 2013-05-26: 25 g via INTRAVENOUS
  Filled 2013-05-26: qty 500

## 2013-05-26 MED ORDER — OXYCODONE HCL 5 MG/5ML PO SOLN: 5.0000 mg | Freq: Once | ORAL | Status: DC | PRN

## 2013-05-26 SURGICAL SUPPLY — 65 items
BLADE SURG 11 STRL SS (BLADE) ×3 IMPLANT
BLADE SURG ROTATE 9660 (MISCELLANEOUS) IMPLANT
CANISTER SUCTION 2500CC (MISCELLANEOUS) ×3 IMPLANT
CHLORAPREP W/TINT 26ML (MISCELLANEOUS) ×3 IMPLANT
COVER MAYO STAND STRL (DRAPES) IMPLANT
COVER SURGICAL LIGHT HANDLE (MISCELLANEOUS) ×3 IMPLANT
DRAPE CAMERA CLOSED 9X96 (DRAPES) ×3 IMPLANT
DRAPE LAPAROSCOPIC ABDOMINAL (DRAPES) ×3 IMPLANT
DRAPE PROXIMA HALF (DRAPES) IMPLANT
DRAPE UTILITY 15X26 W/TAPE STR (DRAPE) ×6 IMPLANT
DRAPE WARM FLUID 44X44 (DRAPE) ×3 IMPLANT
DRSG OPSITE POSTOP 4X10 (GAUZE/BANDAGES/DRESSINGS) IMPLANT
DRSG OPSITE POSTOP 4X8 (GAUZE/BANDAGES/DRESSINGS) IMPLANT
ELECT BLADE 6.5 EXT (BLADE) IMPLANT
ELECT CAUTERY BLADE 6.4 (BLADE) ×3 IMPLANT
ELECT REM PT RETURN 9FT ADLT (ELECTROSURGICAL) ×3
ELECTRODE REM PT RTRN 9FT ADLT (ELECTROSURGICAL) ×1 IMPLANT
FILTER SMOKE EVAC LAPAROSHD (FILTER) IMPLANT
GAUZE SPONGE 4X4 16PLY XRAY LF (GAUZE/BANDAGES/DRESSINGS) ×3 IMPLANT
GLOVE BIO SURGEON STRL SZ7.5 (GLOVE) ×3 IMPLANT
GLOVE BIO SURGEON STRL SZ8 (GLOVE) ×3 IMPLANT
GLOVE BIOGEL PI IND STRL 7.0 (GLOVE) ×2 IMPLANT
GLOVE BIOGEL PI IND STRL 7.5 (GLOVE) ×1 IMPLANT
GLOVE BIOGEL PI IND STRL 8 (GLOVE) ×1 IMPLANT
GLOVE BIOGEL PI INDICATOR 7.0 (GLOVE) ×4
GLOVE BIOGEL PI INDICATOR 7.5 (GLOVE) ×2
GLOVE BIOGEL PI INDICATOR 8 (GLOVE) ×2
GLOVE SKINSENSE NS SZ6.5 (GLOVE) ×2
GLOVE SKINSENSE STRL SZ6.5 (GLOVE) ×1 IMPLANT
GOWN STRL REUS W/ TWL LRG LVL3 (GOWN DISPOSABLE) ×1 IMPLANT
GOWN STRL REUS W/ TWL XL LVL3 (GOWN DISPOSABLE) ×1 IMPLANT
GOWN STRL REUS W/TWL LRG LVL3 (GOWN DISPOSABLE) ×2
GOWN STRL REUS W/TWL XL LVL3 (GOWN DISPOSABLE) ×2
KIT BASIN OR (CUSTOM PROCEDURE TRAY) ×3 IMPLANT
KIT ROOM TURNOVER OR (KITS) ×3 IMPLANT
LIGASURE IMPACT 36 18CM CVD LR (INSTRUMENTS) IMPLANT
NEEDLE 22X1 1/2 (OR ONLY) (NEEDLE) IMPLANT
NEEDLE HYPO 25GX1X1/2 BEV (NEEDLE) ×3 IMPLANT
NS IRRIG 1000ML POUR BTL (IV SOLUTION) ×3 IMPLANT
PACK GENERAL/GYN (CUSTOM PROCEDURE TRAY) ×3 IMPLANT
PAD ARMBOARD 7.5X6 YLW CONV (MISCELLANEOUS) ×6 IMPLANT
PENCIL BUTTON HOLSTER BLD 10FT (ELECTRODE) IMPLANT
SOLUTION ANTI FOG 6CC (MISCELLANEOUS) ×3 IMPLANT
SPECIMEN JAR LARGE (MISCELLANEOUS) IMPLANT
SPONGE GAUZE 4X4 12PLY STER LF (GAUZE/BANDAGES/DRESSINGS) ×3 IMPLANT
SPONGE LAP 18X18 X RAY DECT (DISPOSABLE) ×9 IMPLANT
STAPLER VISISTAT 35W (STAPLE) ×3 IMPLANT
SUCTION POOLE TIP (SUCTIONS) IMPLANT
SUT PDS AB 1 TP1 96 (SUTURE) ×6 IMPLANT
SUT SILK 2 0 SH CR/8 (SUTURE) ×3 IMPLANT
SUT SILK 2 0 TIES 10X30 (SUTURE) ×3 IMPLANT
SUT SILK 3 0 SH CR/8 (SUTURE) ×3 IMPLANT
SUT SILK 3 0 TIES 10X30 (SUTURE) ×3 IMPLANT
SUT VICRYL 0 UR6 27IN ABS (SUTURE) ×3 IMPLANT
SYR CONTROL 10ML LL (SYRINGE) ×3 IMPLANT
TAPE CLOTH SURG 4X10 WHT LF (GAUZE/BANDAGES/DRESSINGS) ×3 IMPLANT
TOWEL OR 17X26 10 PK STRL BLUE (TOWEL DISPOSABLE) ×3 IMPLANT
TOWEL OR NON WOVEN STRL DISP B (DISPOSABLE) ×6 IMPLANT
TRAY FOLEY CATH 16FRSI W/METER (SET/KITS/TRAYS/PACK) ×3 IMPLANT
TROCAR XCEL BLUNT TIP 100MML (ENDOMECHANICALS) ×3 IMPLANT
TROCAR XCEL NON-BLD 5MMX100MML (ENDOMECHANICALS) ×3 IMPLANT
TUBE CONNECTING 12'X1/4 (SUCTIONS) ×1
TUBE CONNECTING 12X1/4 (SUCTIONS) ×2 IMPLANT
TUBING INSUFFLATION 10FT LAP (TUBING) ×3 IMPLANT
YANKAUER SUCT BULB TIP NO VENT (SUCTIONS) IMPLANT

## 2013-05-26 NOTE — Anesthesia Procedure Notes (Signed)
Procedure Name: Intubation Date/Time: 05/26/2013 3:18 PM Performed by: De Nurse Pre-anesthesia Checklist: Patient identified, Emergency Drugs available, Suction available, Patient being monitored and Timeout performed Patient Re-evaluated:Patient Re-evaluated prior to inductionOxygen Delivery Method: Circle system utilized Preoxygenation: Pre-oxygenation with 100% oxygen Intubation Type: IV induction, Rapid sequence and Cricoid Pressure applied Laryngoscope Size: Mac and 4 Grade View: Grade III Tube type: Oral Tube size: 8.0 mm Number of attempts: 1 Airway Equipment and Method: Stylet Placement Confirmation: ETT inserted through vocal cords under direct vision,  positive ETCO2 and breath sounds checked- equal and bilateral Secured at: 23 cm Tube secured with: Tape Dental Injury: Teeth and Oropharynx as per pre-operative assessment

## 2013-05-26 NOTE — Op Note (Signed)
05/24/2013 - 05/26/2013  4:37 PM  PATIENT:  Jerome Sloan  43 y.o. male  PRE-OPERATIVE DIAGNOSIS:  small bowel obstruction  POST-OPERATIVE DIAGNOSIS:  small bowel obstruction due to adhesions  PROCEDURE:  Procedure(s): LAPAROSCOPY DIAGNOSTIC EXPLORATORY LAPAROTOMY LYSIS OF ADHESIONS 45 MINUTES  SURGEON:  Surgeon(s): Liz Malady, MD  ASSISTANTS: none   ANESTHESIA:   local and general  EBL:  Total I/O In: 2460.4 [I.V.:2460.4] Out: 475 [Emesis/NG output:100; Other:300; Blood:75]  BLOOD ADMINISTERED:none  DRAINS: none   SPECIMEN:  No Specimen  DISPOSITION OF SPECIMEN:  N/A  COUNTS:  YES  DICTATION: .Dragon Dictation Patient is brought for exploratory laparoscopy possible laparotomy for small bowel obstruction. He was identified in the preop holding area. He received intravenous antibiotics. He was brought to the operating room. General endotracheal anesthesia was administered by the anesthesia staff. His abdomen was prepped and draped in sterile fashion. We did a time out procedure. Infraumbilical region was infiltrated with local. Infraumbilical incision was made. Subcutaneous tissues were dissected down revealing the anterior fascia. This was divided along the midline. Peritoneal cavity was entered under direct vision. 0 Vicryl pursestring was placed on the fascial opening. Hassan trocar was inserted. Abdomen was insufflated with carbon dioxide in standard fashion. Laparoscopic exploration revealed  Dilated small bowel with Blood-tinged ascites and ecchymotic regions on the bowel. In light of these findings, decision was made to convert to open. Midline incision was extended above and below the infraumbilical incision site. The fascia was opened to the length of the incision. Bowel was inspected. Small bowel is dilated proximally. There were several adhesions to the omentum which were gradually taken down over 45 minutes. These included a large proximal and distal tight band  creating a partial closed-loop obstruction involving over 100 cm of small bowel. The small bowel was viable but had scattered contusions in the mesentery and wall. The adhesiolysis was completed in the small bowel was free from the ligament of Treitz to the terminal ileum. Cecum and right colon appeared normal. Next the nasogastric tube was adjusted in was only partially functional. It was replaced by anesthesia with a larger NG tube. This facilitated milking back some of the small bowel contents into the stomach for evacuation. The small bowel was again inspected. It was much pinker and more healthy looking. No areas were necrotic and no resections were needed. There was good blood flow and again the color was much improved. Bowel was replaced in anatomic position after running it one more time. Omentum was brought over the small bowel. Abdomen was irrigated with saline which returned clear. Local anesthetic was injected along the wound.Fascia was closed with 2 lengths of running #1 looped PDS from each end and tied in the middle. Skin was irrigated and closed with staples. All counts were correct. Patient tolerated the procedure well without apparent complication and was taken recovery in stable condition.  PATIENT DISPOSITION:  PACU - hemodynamically stable.   Delay start of Pharmacological VTE agent (>24hrs) due to surgical blood loss or risk of bleeding:  no  Violeta Gelinas, MD, MPH, FACS Pager: [phone]  3/27/20154:37 PM

## 2013-05-26 NOTE — Anesthesia Preprocedure Evaluation (Addendum)
Anesthesia Evaluation  Patient identified by MRN, date of birth, ID band Patient awake    Reviewed: Allergy & Precautions, H&P , NPO status , Patient's Chart, lab work & pertinent test results  History of Anesthesia Complications Negative for: history of anesthetic complications  Airway Mallampati: II TM Distance: >3 FB Neck ROM: Full    Dental  (+) Teeth Intact, Dental Advisory Given   Pulmonary neg pulmonary ROS, neg sleep apnea, neg COPDformer smoker,  breath sounds clear to auscultation        Cardiovascular - angina- Past MI and - CHF negative cardio ROS  - dysrhythmias - Valvular Problems/MurmursRhythm:Regular Rate:Normal     Neuro/Psych negative neurological ROS  negative psych ROS   GI/Hepatic Neg liver ROS, Bowel obstruction   Endo/Other  negative endocrine ROS  Renal/GU negative Renal ROS     Musculoskeletal  (+) Arthritis -, Rheumatoid disorders,    Abdominal   Peds  Hematology negative hematology ROS (+)   Anesthesia Other Findings   Reproductive/Obstetrics                          Anesthesia Physical Anesthesia Plan  ASA: II  Anesthesia Plan: General   Post-op Pain Management:    Induction: Intravenous  Airway Management Planned: Oral ETT  Additional Equipment: None  Intra-op Plan:   Post-operative Plan: Extubation in OR  Informed Consent: I have reviewed the patients History and Physical, chart, labs and discussed the procedure including the risks, benefits and alternatives for the proposed anesthesia with the patient or authorized representative who has indicated his/her understanding and acceptance.   Dental advisory given  Plan Discussed with: CRNA and Surgeon  Anesthesia Plan Comments:         Anesthesia Quick Evaluation

## 2013-05-26 NOTE — Transfer of Care (Signed)
Immediate Anesthesia Transfer of Care Note  Patient: Jerome Sloan  Procedure(s) Performed: Procedure(s): LAPAROSCOPY DIAGNOSTIC (N/A) EXPLORATORY LAPAROTOMY with Lysis of Adhesion (N/A)  Patient Location: PACU  Anesthesia Type:General  Level of Consciousness: awake, alert  and oriented  Airway & Oxygen Therapy: Patient Spontanous Breathing and Patient connected to nasal cannula oxygen  Post-op Assessment: Report given to PACU RN  Post vital signs: Reviewed and stable  Complications: No apparent anesthesia complications

## 2013-05-26 NOTE — Preoperative (Signed)
Beta Blockers   Reason not to administer Beta Blockers:Not Applicable 

## 2013-05-26 NOTE — Progress Notes (Signed)
Subjective: No flatus nor BM, abdominal pain resolved  Objective: Vital signs in last 24 hours: Temp:  [97.2 F (36.2 C)-99 F (37.2 C)] 97.4 F (36.3 C) (03/27 0648) Pulse Rate:  [83-115] 105 (03/27 0648) Resp:  [16-18] 18 (03/27 0648) BP: (137-141)/(89-109) 137/97 mmHg (03/27 0648) SpO2:  [90 %-98 %] 93 % (03/27 0648) Last BM Date: 05/24/13  Intake/Output from previous day: 03/26 0701 - 03/27 0700 In: 2250 [I.V.:2150; NG/GT:100] Out: 1301 [Urine:900; Emesis/NG output:401] Intake/Output this shift:    General appearance: alert and cooperative Resp: clear to auscultation bilaterally Cardio: regular rate and rhythm GI: soft, mild distention, few BS, NT  Lab Results:   Recent Labs  05/24/13 0626 05/25/13 0552  WBC 14.3* 11.3*  HGB 16.3 14.9  HCT 44.0 40.9  PLT 219 199   BMET  Recent Labs  05/24/13 0626 05/25/13 0552  NA 138 137  K 4.2 4.0  CL 99 100  CO2 21 25  GLUCOSE 157* 139*  BUN 17 12  CREATININE 0.70 0.71  CALCIUM 10.2 9.2   PT/INR No results found for this basename: LABPROT, INR,  in the last 72 hours ABG No results found for this basename: PHART, PCO2, PO2, HCO3,  in the last 72 hours  Studies/Results: Ct Abdomen Pelvis W Contrast  05/24/2013   CLINICAL DATA:  Pain.  EXAM: CT ABDOMEN AND PELVIS WITH CONTRAST  TECHNIQUE: Multidetector CT imaging of the abdomen and pelvis was performed using the standard protocol following bolus administration of intravenous contrast.  CONTRAST:  66mL OMNIPAQUE IOHEXOL 300 MG/ML  SOLN  COMPARISON:  DG ABD ACUTE W/CHEST dated 05/24/2013  FINDINGS: Punctate lucency is seen in the right hepatic lobe consistent with tiny cyst. Liver otherwise normal. Spleen normal. Splenosis. Pancreas is normal. No biliary distention. Gallbladder is nondistended.  Adrenals normal. Tiny angiomyolipoma right kidney. Pelvic kidney on the left. No hydronephrosis or evidence of obstructing ureteral stone. The bladder is nondistended.  No  significant adenopathy. Abdominal aorta normal in caliber. Visceral vessels are patent. Portal vein patent.  The appendix is not definitely identified, but what appears to be the appendix is normal. The colon is nondistended. Multiple distended loops of small bowel are noted. Loops are distended to the level of the pelvis. Mesenteric edema is present. These findings are consistent with small bowel obstruction. The etiology of small bowel obstruction is not determined. NG tube is noted coiled in the stomach. There is mild gastric distention. No free air. No hernia.  Lung bases clear.  No acute bony abnormality.  Heart size normal.  IMPRESSION: 1. Findings most consistent with small bowel obstruction at the level of the mid pelvis. Etiology of the obstruction is unclear. 2. NG tube noted coiled in stomach. Debris and fluid noted in the stomach. 3. Left pelvic kidney.   Electronically Signed   By: Maisie Fus  Register   On: 05/24/2013 11:40   Dg Abd 2 Views  05/25/2013   CLINICAL DATA:  Evaluate small bowel obstruction  EXAM: ABDOMEN - 2 VIEW  COMPARISON:  May 24, 2013 CT abdomen and pelvis.  FINDINGS: A nasogastric tube is coiled in the stomach with distal tip in the mid stomach. There are air-filled dilated small bowel loops in the left upper quadrant probably unchanged compared to prior CT. No definite air is identified in the colon.  IMPRESSION: Persistent small bowel obstruction.   Electronically Signed   By: Sherian Rein M.D.   On: 05/25/2013 08:23    Anti-infectives: Anti-infectives  Start     Dose/Rate Route Frequency Ordered Stop   05/26/13 1400  cefOXitin (MEFOXIN) 1 g in dextrose 5 % 50 mL IVPB     1 g 100 mL/hr over 30 Minutes Intravenous  Once 05/26/13 0756        Assessment/Plan: SBO - not improving. Will proceed with exploratory laparoscopy, possible exploratory laparotomy. Disease process, procedure, risks, and benefits D/W him and his wife in [place] and [place]. I answered their  questions. He is agreeable. Plan will change only if abdominal x-rays show a lot of improvement this AM which, in light of no clinical progress, I doubt will be the case.  LOS: 2 days    Dent Plantz E 05/26/2013

## 2013-05-26 NOTE — Anesthesia Postprocedure Evaluation (Signed)
Anesthesia Post Note  Patient: Jerome Sloan  Procedure(s) Performed: Procedure(s) (LRB): LAPAROSCOPY DIAGNOSTIC (N/A) EXPLORATORY LAPAROTOMY with Lysis of Adhesion (N/A)  Anesthesia type: General  Patient location: PACU  Post pain: Pain level controlled and Adequate analgesia  Post assessment: Post-op Vital signs reviewed, Patient's Cardiovascular Status Stable, Respiratory Function Stable, Patent Airway and Pain level controlled  Last Vitals:  Filed Vitals:   05/26/13 1756  BP: 143/99  Pulse: 95  Temp:   Resp: 16    Post vital signs: Reviewed and stable  Level of consciousness: awake, alert  and oriented  Complications: No apparent anesthesia complications

## 2013-05-27 LAB — BASIC METABOLIC PANEL
BUN: 9 mg/dL (ref 6–23)
CALCIUM: 8.4 mg/dL (ref 8.4–10.5)
CO2: 27 meq/L (ref 19–32)
CREATININE: 0.7 mg/dL (ref 0.50–1.35)
Chloride: 100 mEq/L (ref 96–112)
GFR calc Af Amer: >90 mL/min (ref >90)
GLUCOSE: 126 mg/dL — AB (ref 70–99)
Potassium: 3.8 mEq/L (ref 3.7–5.3)
SODIUM: 138 meq/L (ref 137–147)

## 2013-05-27 LAB — CBC
HCT: 36.6 % — ABNORMAL LOW (ref 39.0–52.0)
Hemoglobin: 12.6 g/dL — ABNORMAL LOW (ref 13.0–17.0)
MCH: 30.2 pg (ref 26.0–34.0)
MCHC: 34.4 g/dL (ref 30.0–36.0)
MCV: 87.8 fL (ref 78.0–100.0)
Platelets: 154 10*3/uL (ref 150–400)
RBC: 4.17 MIL/uL — ABNORMAL LOW (ref 4.22–5.81)
RDW: 13.1 % (ref 11.5–15.5)
WBC: 8.3 10*3/uL (ref 4.0–10.5)

## 2013-05-27 NOTE — Progress Notes (Signed)
Patient well pleased with pain control on PCA, rates at a 3 when at rest.  Patient ambulatory around complete length of 6[address] twice on day shift, also sat up in chair.

## 2013-05-27 NOTE — Progress Notes (Signed)
Please note that patient opted not to use the PCA on evening shift.  He wanted to see how he did with Dilaudid injections as ordered PRN; also did not want to be hooked up to additional tubing, etc.  Will continue to monitor.

## 2013-05-27 NOTE — Progress Notes (Signed)
1 Day Post-Op  Subjective: Pain ok. No n/v. No flatus  Objective: Vital signs in last 24 hours: Temp:  [97.5 F (36.4 C)-99.5 F (37.5 C)] 99.2 F (37.3 C) (03/28 0910) Pulse Rate:  [84-109] 97 (03/28 0910) Resp:  [13-20] 20 (03/28 0910) BP: (121-152)/(76-112) 123/88 mmHg (03/28 0910) SpO2:  [95 %-98 %] 97 % (03/28 0910) Last BM Date: 05/24/13  Intake/Output from previous day: 06/16/2022 0701 - 03/28 0700 In: 5020.8 [I.V.:4520.8; IV Piggyback:500] Out: 1825 [Urine:1250; Emesis/NG output:200; Blood:75] Intake/Output this shift:    Alert, nad cta b/l Reg Some distension, hypoBS. approp TTP. Dressing with serosang drainage No edema  Lab Results:   Recent Labs  05/25/13 0552 05/27/13 0521  WBC 11.3* 8.3  HGB 14.9 12.6*  HCT 40.9 36.6*  PLT 199 154   BMET  Recent Labs  05/25/13 0552 05/27/13 0521  NA 137 138  K 4.0 3.8  CL 100 100  CO2 25 27  GLUCOSE 139* 126*  BUN 12 9  CREATININE 0.71 0.70  CALCIUM 9.2 8.4   PT/INR No results found for this basename: LABPROT, INR,  in the last 72 hours ABG No results found for this basename: PHART, PCO2, PO2, HCO3,  in the last 72 hours  Studies/Results: Dg Abd 2 Views  06/15/2013   CLINICAL DATA:  Small bowel obstruction, abscess placement of nasogastric tube.  EXAM: ABDOMEN - 2 VIEW  COMPARISON:  DG ABD 2 VIEWS dated 05/25/2013  FINDINGS: There remain loops of mildly distended gas-filled small bowel in the left upper quadrant of the abdomen. Fluid filled bowel loops are noted in the left mid and lower abdomen on both views. No free extraluminal gas collections are demonstrated. There is no significant colonic gas. The stomach does not appear abnormally distended. The esophagogastric tube tip and proximal port lie below the expected level of the GE junction.  IMPRESSION: There has not been significant interval change in the appearance of the small bowel obstruction.   Electronically Signed   By: David  [place]   On: 06-15-2013  12:38    Anti-infectives: Anti-infectives   Start     Dose/Rate Route Frequency Ordered Stop   June 15, 2013 1400  [MAR Hold]  cefOXitin (MEFOXIN) 1 g in dextrose 5 % 50 mL IVPB     (On MAR Hold since 2013-06-15 1348)   1 g 100 mL/hr over 30 Minutes Intravenous  Once 2013/06/15 0756 06/15/2013 1519      Assessment/Plan: pSBO - closed loop bowel obstruction s/p Procedure(s): LAPAROSCOPY DIAGNOSTIC (N/A) EXPLORATORY LAPAROTOMY with Lysis of Adhesion (N/A)  Oob, ambulate Cont bowel rest, ngt to LIWS D/c foley PCA VTE prophylaxis  Jerome Sloan. Jerome Campanile, MD, FACS General, Bariatric, & Minimally Invasive Surgery [facility], [place]   LOS: 3 days    Jerome Sloan 05/27/2013

## 2013-05-28 LAB — BASIC METABOLIC PANEL
BUN: 6 mg/dL (ref 6–23)
CALCIUM: 8.9 mg/dL (ref 8.4–10.5)
CO2: 25 mEq/L (ref 19–32)
CREATININE: 0.59 mg/dL (ref 0.50–1.35)
Chloride: 98 mEq/L (ref 96–112)
GFR calc non Af Amer: >90 mL/min (ref >90)
GLUCOSE: 119 mg/dL — AB (ref 70–99)
POTASSIUM: 3.3 meq/L — AB (ref 3.7–5.3)
Sodium: 135 mEq/L — ABNORMAL LOW (ref 137–147)

## 2013-05-28 LAB — CBC
HCT: 33.7 % — ABNORMAL LOW (ref 39.0–52.0)
Hemoglobin: 12 g/dL — ABNORMAL LOW (ref 13.0–17.0)
MCH: 30.7 pg (ref 26.0–34.0)
MCHC: 35.6 g/dL (ref 30.0–36.0)
MCV: 86.2 fL (ref 78.0–100.0)
PLATELETS: 156 10*3/uL (ref 150–400)
RBC: 3.91 MIL/uL — ABNORMAL LOW (ref 4.22–5.81)
RDW: 12.7 % (ref 11.5–15.5)
WBC: 6.7 10*3/uL (ref 4.0–10.5)

## 2013-05-28 LAB — MAGNESIUM: Magnesium: 1.7 mg/dL (ref 1.5–2.5)

## 2013-05-28 MED ORDER — POTASSIUM CHLORIDE 10 MEQ/100ML IV SOLN
10.0000 meq | INTRAVENOUS | Status: AC
  Administered 2013-05-28 (×3): 10 meq via INTRAVENOUS
  Filled 2013-05-28 (×2): qty 100

## 2013-05-28 MED ORDER — ZOLPIDEM TARTRATE 5 MG PO TABS
5.0000 mg | ORAL_TABLET | Freq: Every evening | ORAL | Status: DC | PRN
  Administered 2013-05-28: 5 mg via ORAL
  Filled 2013-05-28 (×2): qty 1

## 2013-05-28 MED ORDER — HYDROMORPHONE HCL PF 1 MG/ML IJ SOLN
1.0000 mg | INTRAMUSCULAR | Status: DC | PRN
  Administered 2013-05-28 – 2013-05-29 (×5): 1 mg via INTRAVENOUS
  Administered 2013-05-29 (×3): 2 mg via INTRAVENOUS
  Filled 2013-05-28 (×3): qty 2
  Filled 2013-05-28: qty 1
  Filled 2013-05-28: qty 2
  Filled 2013-05-28 (×2): qty 1
  Filled 2013-05-28: qty 2

## 2013-05-28 MED ORDER — WHITE PETROLATUM GEL
Status: AC
  Administered 2013-05-28: 0.2
  Filled 2013-05-28: qty 5

## 2013-05-28 MED ORDER — POTASSIUM CHLORIDE 10 MEQ/100ML IV SOLN
10.0000 meq | Freq: Once | INTRAVENOUS | Status: AC
  Administered 2013-05-28: 10 meq via INTRAVENOUS
  Filled 2013-05-28: qty 100

## 2013-05-28 NOTE — ED Provider Notes (Signed)
Medical screening examination/treatment/procedure(s) were performed by non-physician practitioner and as supervising physician I was immediately available for consultation/collaboration.   EKG Interpretation None        Brandt Loosen, MD 05/28/13 [phone]

## 2013-05-28 NOTE — Progress Notes (Signed)
Pt requested the PCA be d/c'd due to the noise, "I can't sleep."  Pt stated he would rather call for pain medication if needed.  PCA removed.  Remaining 65ml Dilauded wasted in sink witnessed by [place], Rn.  Also wasted in pyxsis

## 2013-05-28 NOTE — Progress Notes (Signed)
2 Days Post-Op  Subjective: Pt requested his PCA be stopped last night due to beeping. Ng tube has been clamped for several hrs. Pt denies n/v/worsening abd pain. No flatus  Objective: Vital signs in last 24 hours: Temp:  [98.3 F (36.8 C)-99.1 F (37.3 C)] 98.7 F (37.1 C) (03/29 0609) Pulse Rate:  [92-112] 92 (03/29 0609) Resp:  [16-28] 16 (03/29 0609) BP: (134-147)/(89-98) 139/89 mmHg (03/29 0609) SpO2:  [94 %-99 %] 94 % (03/29 0609) Last BM Date: 05/24/13  Intake/Output from previous day: 03/28 0701 - 03/29 0700 In: 2675 [I.V.:2625; NG/GT:50] Out: 1500 [Urine:1450; Emesis/NG output:50] Intake/Output this shift:    Alert, nad cta b/l Reg Soft, very mild distension. Bruising around incision, prob hematoma but no cellulitis. No BS  Lab Results:   Recent Labs  05/27/13 0521 05/28/13 0510  WBC 8.3 6.7  HGB 12.6* 12.0*  HCT 36.6* 33.7*  PLT 154 156   BMET  Recent Labs  05/27/13 0521 05/28/13 0510  NA 138 135*  K 3.8 3.3*  CL 100 98  CO2 27 25  GLUCOSE 126* 119*  BUN 9 6  CREATININE 0.70 0.59  CALCIUM 8.4 8.9   PT/INR No results found for this basename: LABPROT, INR,  in the last 72 hours ABG No results found for this basename: PHART, PCO2, PO2, HCO3,  in the last 72 hours  Studies/Results: Dg Abd 2 Views  06-09-13   CLINICAL DATA:  Small bowel obstruction, abscess placement of nasogastric tube.  EXAM: ABDOMEN - 2 VIEW  COMPARISON:  DG ABD 2 VIEWS dated 05/25/2013  FINDINGS: There remain loops of mildly distended gas-filled small bowel in the left upper quadrant of the abdomen. Fluid filled bowel loops are noted in the left mid and lower abdomen on both views. No free extraluminal gas collections are demonstrated. There is no significant colonic gas. The stomach does not appear abnormally distended. The esophagogastric tube tip and proximal port lie below the expected level of the GE junction.  IMPRESSION: There has not been significant interval change in  the appearance of the small bowel obstruction.   Electronically Signed   By: David  [place]   On: 06-09-13 12:38    Anti-infectives: Anti-infectives   Start     Dose/Rate Route Frequency Ordered Stop   09-Jun-2013 1400  [MAR Hold]  cefOXitin (MEFOXIN) 1 g in dextrose 5 % 50 mL IVPB     (On MAR Hold since 06-09-2013 1348)   1 g 100 mL/hr over 30 Minutes Intravenous  Once 06-09-2013 0756 06-09-2013 1519      Assessment/Plan: s/p Procedure(s): LAPAROSCOPY DIAGNOSTIC (N/A) EXPLORATORY LAPAROTOMY with Lysis of Adhesion (N/A)  D/c ng tube - min output Sips only today Ambulate, is Hypokalemia - replace potassium  [name]. [name], MD, FACS General, Bariatric, & Minimally Invasive Surgery [facility], [place]   LOS: 4 days    Atilano Ina 05/28/2013

## 2013-05-29 ENCOUNTER — Inpatient Hospital Stay (HOSPITAL_COMMUNITY): Payer: PRIVATE HEALTH INSURANCE

## 2013-05-29 LAB — BASIC METABOLIC PANEL
BUN: 7 mg/dL (ref 6–23)
CO2: 25 meq/L (ref 19–32)
Calcium: 8.9 mg/dL (ref 8.4–10.5)
Chloride: 95 mEq/L — ABNORMAL LOW (ref 96–112)
Creatinine, Ser: 0.59 mg/dL (ref 0.50–1.35)
GFR calc Af Amer: >90 mL/min (ref >90)
Glucose, Bld: 134 mg/dL — ABNORMAL HIGH (ref 70–99)
Potassium: 3.7 mEq/L (ref 3.7–5.3)
SODIUM: 132 meq/L — AB (ref 137–147)

## 2013-05-29 LAB — MAGNESIUM: MAGNESIUM: 1.7 mg/dL (ref 1.5–2.5)

## 2013-05-29 MED ORDER — HYDROMORPHONE HCL PF 1 MG/ML IJ SOLN
1.0000 mg | INTRAMUSCULAR | Status: DC | PRN
  Administered 2013-05-29 – 2013-05-30 (×6): 2 mg via INTRAVENOUS
  Administered 2013-05-30: 1 mg via INTRAVENOUS
  Administered 2013-05-30: 2 mg via INTRAVENOUS
  Filled 2013-05-29 (×3): qty 2
  Filled 2013-05-29: qty 1
  Filled 2013-05-29 (×4): qty 2

## 2013-05-29 NOTE — Progress Notes (Signed)
Agree with above 

## 2013-05-29 NOTE — Care Management Note (Signed)
  Page 2 of 2   05/29/2013     12:44:17 PM   CARE MANAGEMENT NOTE 05/29/2013  Patient:  Jerome Sloan,Jerome Sloan   Account Number:  [id number]  Date Initiated:  05/29/2013  Documentation initiated by:  Ronny Flurry  Subjective/Objective Assessment:     Action/Plan:   Anticipated DC Date:     Anticipated DC Plan:           Choice offered to / List presented to:             Status of service:   Medicare Important Message given?   (If response is "NO", the following Medicare IM given date fields will be blank) Date Medicare IM given:   Date Additional Medicare IM given:    Discharge Disposition:    Per UR Regulation:    If discussed at Long Length of Stay Meetings, dates discussed:    Comments:  05-29-13 air flight will not be covered by insurance due to fact it is not a medical  necessity , cost would be $25 000 out of pocket for patient . Patient aware . Patient stated he did not want to be transferred , his wife wanted him transferred . Ronny Flurry RN BSN [phone]    05-29-13 Patient  wants to be transported home to [facility] . PA said patient would need to have medical flight home and would be in a few days .  Spoke with patient and his wife at bedside.  Patient's wife stated she has talked to patient's PCP DR Dorita Fray office number [phone] , cell [phone] , wife states DR Harrel Carina has agreed to accept patient . Patient [place] go [place] [facility] [place]  Called [facility] [phone] , spoke with Vernona Rieger who stated if transport is for patient request only then insurance will probably not pay . If patient is being transported for a medical reason that can not be handle at [facility] then insurance may cover some of cost .  Faxed demo sheet to Vernona Rieger at [phone] to run insurance .  Ronny Flurry RN BSN [phone]

## 2013-05-29 NOTE — Progress Notes (Signed)
Patient ID: Jerome Sloan, male   DOB: 1971/02/05, 43 y.o.   MRN: [id number] 3 Days Post-Op  Subjective: Pt doesn't feels great.  Not sleeping well.  No flatus since NGT pulled yesterday.  Feels bloated.  Trying to mobilize.  + belching, but no nausea  Objective: Vital signs in last 24 hours: Temp:  [98.7 F (37.1 C)-99.2 F (37.3 C)] 99.2 F (37.3 C) (03/30 0616) Pulse Rate:  [99-109] 99 (03/30 0616) Resp:  [18-19] 19 (03/30 0616) BP: (119-135)/(81-94) 135/89 mmHg (03/30 0616) SpO2:  [95 %-97 %] 95 % (03/30 0616) Last BM Date: 05/24/13  Intake/Output from previous day: 03/29 0701 - 03/30 0700 In: 3615 [P.O.:240; I.V.:3375] Out: -  Intake/Output this shift:    PE: Abd: distended, hematoma under abdominal incision.  Incision is intact right now with staples.  No evidence of infection.  Absent BS, appropriately tender.  Lab Results:   Recent Labs  05/27/13 0521 05/28/13 0510  WBC 8.3 6.7  HGB 12.6* 12.0*  HCT 36.6* 33.7*  PLT 154 156   BMET  Recent Labs  05/28/13 0510 05/29/13 0556  NA 135* 132*  K 3.3* 3.7  CL 98 95*  CO2 25 25  GLUCOSE 119* 134*  BUN 6 7  CREATININE 0.59 0.59  CALCIUM 8.9 8.9   PT/INR No results found for this basename: LABPROT, INR,  in the last 72 hours CMP     Component Value Date/Time   NA 132* 05/29/2013 0556   K 3.7 05/29/2013 0556   CL 95* 05/29/2013 0556   CO2 25 05/29/2013 0556   GLUCOSE 134* 05/29/2013 0556   BUN 7 05/29/2013 0556   CREATININE 0.59 05/29/2013 0556   CALCIUM 8.9 05/29/2013 0556   PROT 8.2 05/24/2013 0626   ALBUMIN 4.8 05/24/2013 0626   AST 17 05/24/2013 0626   ALT 18 05/24/2013 0626   ALKPHOS 53 05/24/2013 0626   BILITOT 0.8 05/24/2013 0626   GFRNONAA >90 05/29/2013 0556   GFRAA >90 05/29/2013 0556   Lipase     Component Value Date/Time   LIPASE 33 05/24/2013 0626       Studies/Results: No results found.  Anti-infectives: Anti-infectives   Start     Dose/Rate Route Frequency Ordered Stop   05/26/13  1400  [MAR Hold]  cefOXitin (MEFOXIN) 1 g in dextrose 5 % 50 mL IVPB     (On MAR Hold since 05/26/13 1348)   1 g 100 mL/hr over 30 Minutes Intravenous  Once 05/26/13 0756 05/26/13 1519       Assessment/Plan  1. POD 3, s/p ex lap with LOA 2. Post op ileus 3. Abdominal hematoma under incision  Plan: 1. Check some films today.  Suspect patient with increasing distention secondary to ileus. ? If he will need NGT replaced.  He has been backed off from clears to just sips and chips.  Although, it appears from yesterday's noted that he was only supposed to be getting sips and chips and no tray. 2. Follow hematoma.  No need right now to hold Lovenox.  hgb stable.  Will recheck in the am.  If this changes, we may need to hold this. 3. Patient has ativan ordered and states that helped him a lot last night for sleep.  Will cont this.  Will also ask the techs to not disturb patient over night if his vitals are stable.   4. Patient and wife also state they have a Development worker, international aid in [address] that is agreeable to accept  him in transfer when stable.  I will have our CM and SW begin to look into this in case this becomes a possibility. 5. mobilize  LOS: 5 days    Domnique Vantine E 05/29/2013, 10:14 AM Pager: [phone]

## 2013-05-30 ENCOUNTER — Encounter (HOSPITAL_COMMUNITY): Payer: Self-pay | Admitting: General Surgery

## 2013-05-30 LAB — CBC
HCT: 33.3 % — ABNORMAL LOW (ref 39.0–52.0)
HEMOGLOBIN: 11.5 g/dL — AB (ref 13.0–17.0)
MCH: 30.3 pg (ref 26.0–34.0)
MCHC: 34.5 g/dL (ref 30.0–36.0)
MCV: 87.9 fL (ref 78.0–100.0)
Platelets: 233 10*3/uL (ref 150–400)
RBC: 3.79 MIL/uL — ABNORMAL LOW (ref 4.22–5.81)
RDW: 12.8 % (ref 11.5–15.5)
WBC: 4.1 10*3/uL (ref 4.0–10.5)

## 2013-05-30 LAB — BASIC METABOLIC PANEL
BUN: 9 mg/dL (ref 6–23)
CO2: 27 meq/L (ref 19–32)
CREATININE: 0.71 mg/dL (ref 0.50–1.35)
Calcium: 8.8 mg/dL (ref 8.4–10.5)
Chloride: 100 mEq/L (ref 96–112)
GFR calc Af Amer: >90 mL/min (ref >90)
GLUCOSE: 107 mg/dL — AB (ref 70–99)
Potassium: 4 mEq/L (ref 3.7–5.3)
Sodium: 137 mEq/L (ref 137–147)

## 2013-05-30 MED ORDER — FAMOTIDINE IN NACL 20-0.9 MG/50ML-% IV SOLN
20.0000 mg | INTRAVENOUS | Status: DC
  Administered 2013-05-31 – 2013-06-03 (×4): 20 mg via INTRAVENOUS
  Filled 2013-05-30 (×6): qty 50

## 2013-05-30 MED ORDER — GI COCKTAIL ~~LOC~~
30.0000 mL | Freq: Once | ORAL | Status: AC
  Administered 2013-05-30: 30 mL via ORAL
  Filled 2013-05-30: qty 30

## 2013-05-30 NOTE — Progress Notes (Signed)
Patient ID: Baruc Tugwell, male   DOB: 14-Dec-1970, 43 y.o.   MRN: [id number] 4 Days Post-Op  Subjective: Pt feels much better today.  Slept for 5 hours straight last night.  Had 3 BMs yesterday.  Passing flatus.  No nausea  Objective: Vital signs in last 24 hours: Temp:  [97.5 F (36.4 C)-98.2 F (36.8 C)] 97.5 F (36.4 C) 06-08-2022 2148) Pulse Rate:  [102-107] 102 06/08/2022 2148) Resp:  [18] 18 06-08-22 2148) BP: (127-146)/(95-96) 127/95 mmHg 06/08/22 2148) SpO2:  [95 %] 95 % 06-08-2022 2148) Last BM Date: 07-Jun-2013  Intake/Output from previous day: 2022/06/08 0701 - 03/31 0700 In: 1245 [P.O.:120; I.V.:1125] Out: -  Intake/Output this shift:    PE: Abd: softer than yesterday with less distention, few BS, hematoma has expanded some, but it's purple and no evidence for active bleeding.  Otherwise incision is c/d/i with staples Heart: regular Lungs: CTAB  Lab Results:   Recent Labs  05/28/13 0510 05/30/13 0547  WBC 6.7 4.1  HGB 12.0* 11.5*  HCT 33.7* 33.3*  PLT 156 233   BMET  Recent Labs  06/07/13 0556 05/30/13 0547  NA 132* 137  K 3.7 4.0  CL 95* 100  CO2 25 27  GLUCOSE 134* 107*  BUN 7 9  CREATININE 0.59 0.71  CALCIUM 8.9 8.8   PT/INR No results found for this basename: LABPROT, INR,  in the last 72 hours CMP     Component Value Date/Time   NA 137 05/30/2013 0547   K 4.0 05/30/2013 0547   CL 100 05/30/2013 0547   CO2 27 05/30/2013 0547   GLUCOSE 107* 05/30/2013 0547   BUN 9 05/30/2013 0547   CREATININE 0.71 05/30/2013 0547   CALCIUM 8.8 05/30/2013 0547   PROT 8.2 05/24/2013 0626   ALBUMIN 4.8 05/24/2013 0626   AST 17 05/24/2013 0626   ALT 18 05/24/2013 0626   ALKPHOS 53 05/24/2013 0626   BILITOT 0.8 05/24/2013 0626   GFRNONAA >90 05/30/2013 0547   GFRAA >90 05/30/2013 0547   Lipase     Component Value Date/Time   LIPASE 33 05/24/2013 0626       Studies/Results: Dg Abd 2 Views  06-07-2013   CLINICAL DATA:  Followup postop adynamic ileus versus obstruction.  EXAM:  ABDOMEN - 2 VIEW  COMPARISON:  05/26/2013  FINDINGS: There is less small bowel dilation, and air is now evident in the colon. There are still air-fluid levels. Nasogastric tube has been removed. There is no free air.  Abdominal soft tissues are unremarkable. There are midline staples. Postsurgical changes new from the prior study.  IMPRESSION: 1. Mild small bowel dilation with multiple air-fluid levels in small bowel and in the right colon. Findings are most suggestive of a postoperative adynamic ileus. No free air.   Electronically Signed   By: Amie Portland M.D.   On: 2013-06-07 16:06    Anti-infectives: Anti-infectives   Start     Dose/Rate Route Frequency Ordered Stop   05/26/13 1400  [MAR Hold]  cefOXitin (MEFOXIN) 1 g in dextrose 5 % 50 mL IVPB     (On MAR Hold since 05/26/13 1348)   1 g 100 mL/hr over 30 Minutes Intravenous  Once 05/26/13 0756 05/26/13 1519       Assessment/Plan  1. POD 4, s/p ex lap with LOA 2. Post op ileus, improving 3. Abdominal hematoma under incision  Plan: 1. Will give clear liquids today 2. Continue mobilizing and pulm toilet.   3. Patient looks  better today   LOS: 6 days    Paizley Ramella E 05/30/2013, 10:36 AM Pager: [phone]

## 2013-05-30 NOTE — Progress Notes (Signed)
Patient ambulated twice in the hall today. Had another BM and he says" I feel better".  Has also tolerated the clear liquids as at now. Will continue to monitor.

## 2013-05-30 NOTE — Progress Notes (Signed)
Agree with above 

## 2013-05-31 ENCOUNTER — Inpatient Hospital Stay (HOSPITAL_COMMUNITY): Payer: PRIVATE HEALTH INSURANCE

## 2013-05-31 NOTE — Progress Notes (Signed)
Agree with above, nothing appears that more than ileus is going on now.  Will check lab and films in am.  If anything concerning can consider a ct scan. They want a second opinion and will ask Dr Janee Morn to come see them. They also request a gi dr to talk with them and I will discuss with them in am

## 2013-05-31 NOTE — Progress Notes (Signed)
Patient ID: Jerome Sloan, male   DOB: 1970/03/20, 43 y.o.   MRN: [id number] 5 Days Post-Op  Subjective: Pt feels worse today.  Have bilious diarrhea and emesis overnight.  Seems appropriately frustrated.  States wife didn't want to talk with [place] yesterday which is why we never got called to update her.  Objective: Vital signs in last 24 hours: Temp:  [98.3 F (36.8 C)-98.8 F (37.1 C)] 98.3 F (36.8 C) (04/01 0517) Pulse Rate:  [101-104] 101 (04/01 0517) Resp:  [18] 18 (04/01 0517) BP: (135-138)/(91-97) 138/95 mmHg (04/01 0517) SpO2:  [96 %-97 %] 96 % (04/01 0517) Last BM Date: 2013-06-08  Intake/Output from previous day: 03/31 0701 - 04/01 0700 In: 5110 [P.O.:360; I.V.:4750] Out: -  Intake/Output this shift:    PE: Abd: more distended, few BS, hematoma stable, incision is c/d/i with staples Heart: regular Lungs: CTAB  Lab Results:   Recent Labs  05/30/13 0547  WBC 4.1  HGB 11.5*  HCT 33.3*  PLT 233   BMET  Recent Labs  08-Jun-2013 0556 05/30/13 0547  NA 132* 137  K 3.7 4.0  CL 95* 100  CO2 25 27  GLUCOSE 134* 107*  BUN 7 9  CREATININE 0.59 0.71  CALCIUM 8.9 8.8   PT/INR No results found for this basename: LABPROT, INR,  in the last 72 hours CMP     Component Value Date/Time   NA 137 05/30/2013 0547   K 4.0 05/30/2013 0547   CL 100 05/30/2013 0547   CO2 27 05/30/2013 0547   GLUCOSE 107* 05/30/2013 0547   BUN 9 05/30/2013 0547   CREATININE 0.71 05/30/2013 0547   CALCIUM 8.8 05/30/2013 0547   PROT 8.2 05/24/2013 0626   ALBUMIN 4.8 05/24/2013 0626   AST 17 05/24/2013 0626   ALT 18 05/24/2013 0626   ALKPHOS 53 05/24/2013 0626   BILITOT 0.8 05/24/2013 0626   GFRNONAA >90 05/30/2013 0547   GFRAA >90 05/30/2013 0547   Lipase     Component Value Date/Time   LIPASE 33 05/24/2013 0626       Studies/Results: Dg Abd 2 Views  08-Jun-2013   CLINICAL DATA:  Followup postop adynamic ileus versus obstruction.  EXAM: ABDOMEN - 2 VIEW  COMPARISON:  05/26/2013   FINDINGS: There is less small bowel dilation, and air is now evident in the colon. There are still air-fluid levels. Nasogastric tube has been removed. There is no free air.  Abdominal soft tissues are unremarkable. There are midline staples. Postsurgical changes new from the prior study.  IMPRESSION: 1. Mild small bowel dilation with multiple air-fluid levels in small bowel and in the right colon. Findings are most suggestive of a postoperative adynamic ileus. No free air.   Electronically Signed   By: Amie Portland M.D.   On: 2013-06-08 16:06    Anti-infectives: Anti-infectives   Start     Dose/Rate Route Frequency Ordered Stop   05/26/13 1400  [MAR Hold]  cefOXitin (MEFOXIN) 1 g in dextrose 5 % 50 mL IVPB     (On MAR Hold since 05/26/13 1348)   1 g 100 mL/hr over 30 Minutes Intravenous  Once 05/26/13 0756 05/26/13 1519       Assessment/Plan  1. POD 5, s/p ex lap with LOA  2. Post op ileus 3. Abdominal hematoma under incision  Plan: 1. Patient with emesis again, bilious, and more abdominal distention.  Will repeat films.  May likely require NGT to be placed.  Back off to NPO 2.  Diarrhea present, but do not want to add any type of anti-diarrheal given persistent ileus.  Will follow.  Do not see any reason yet to check a c. Diff or suspect that. 3. Patient and I had a long discussion about all of this.  He seems to understand this for the most part, but also seems frustrated as well, which is understandable.  I have offered to speak to his wife later if wanted. 4. Check labs in am.   LOS: 7 days    Murline Weigel E 05/31/2013, 8:38 AM Pager: [phone]

## 2013-06-01 DIAGNOSIS — E876 Hypokalemia: Secondary | ICD-10-CM

## 2013-06-01 LAB — BASIC METABOLIC PANEL
BUN: 4 mg/dL — ABNORMAL LOW (ref 6–23)
CO2: 26 mEq/L (ref 19–32)
CREATININE: 0.67 mg/dL (ref 0.50–1.35)
Calcium: 8.4 mg/dL (ref 8.4–10.5)
Chloride: 102 mEq/L (ref 96–112)
GFR calc Af Amer: >90 mL/min (ref >90)
Glucose, Bld: 104 mg/dL — ABNORMAL HIGH (ref 70–99)
Potassium: 3.6 mEq/L — ABNORMAL LOW (ref 3.7–5.3)
SODIUM: 140 meq/L (ref 137–147)

## 2013-06-01 LAB — CBC
HCT: 32.9 % — ABNORMAL LOW (ref 39.0–52.0)
HCT: 34.3 % — ABNORMAL LOW (ref 39.0–52.0)
Hemoglobin: 11.5 g/dL — ABNORMAL LOW (ref 13.0–17.0)
Hemoglobin: 12.1 g/dL — ABNORMAL LOW (ref 13.0–17.0)
MCH: 30.6 pg (ref 26.0–34.0)
MCH: 30.6 pg (ref 26.0–34.0)
MCHC: 35 g/dL (ref 30.0–36.0)
MCHC: 35.3 g/dL (ref 30.0–36.0)
MCV: 87.5 fL (ref 78.0–100.0)
MCV: 86.8 fL (ref 78.0–100.0)
PLATELETS: 251 10*3/uL (ref 150–400)
Platelets: 274 10*3/uL (ref 150–400)
RBC: 3.76 MIL/uL — ABNORMAL LOW (ref 4.22–5.81)
RBC: 3.95 MIL/uL — AB (ref 4.22–5.81)
RDW: 12.9 % (ref 11.5–15.5)
RDW: 12.9 % (ref 11.5–15.5)
WBC: 6.5 10*3/uL (ref 4.0–10.5)
WBC: 6.1 10*3/uL (ref 4.0–10.5)

## 2013-06-01 MED ORDER — POTASSIUM CHLORIDE 10 MEQ/100ML IV SOLN
10.0000 meq | INTRAVENOUS | Status: AC
  Administered 2013-06-01 (×2): 10 meq via INTRAVENOUS
  Filled 2013-06-01: qty 100

## 2013-06-01 MED ORDER — PHENOL 1.4 % MT LIQD
1.0000 | OROMUCOSAL | Status: DC | PRN
  Administered 2013-06-01: 1 via OROMUCOSAL
  Filled 2013-06-01: qty 177

## 2013-06-01 NOTE — Consult Note (Signed)
Reason for Consult: Ileus s/p SBO Referring Physician: CCS  Rory Percy HPI: This is a 43 year old male with a PMH of psoriasis admitted for a SBO.  His symptoms started abruptly and the imaging diagnosed with an SBO.  He denied any prior history of abdominal surgeries and he underwent surgical intervention on 05/24/2013.  A nearly closed loop 100 cm segment of small bowel as affected.  The adhesions were released and over the time course his bowels have been slow to recover from the ileus.  At the families request a GI consultation was ordered.  An NG tube was restarted after he was not able tolerate any PO.  His electrolytes have been normal and he is not on any narcotics.  He is able to have bowel movements.  Past Medical History  Diagnosis Date  . Psoriasis   . Rheumatoid arthritis     "related to my psorasis"    Past Surgical History  Procedure Laterality Date  . Sinus exploration    . Laparoscopy N/A 05/26/2013    Procedure: LAPAROSCOPY DIAGNOSTIC;  Surgeon: Liz Malady, MD;  Location: [facility];  Service: General;  Laterality: N/A;  . Laparotomy N/A 05/26/2013    Procedure: EXPLORATORY LAPAROTOMY with Lysis of Adhesion;  Surgeon: Liz Malady, MD;  Location: [facility];  Service: General;  Laterality: N/A;    Family History  Problem Relation Age of Onset  . Alcohol abuse Father   . Diabetes Father     Social History:  reports that he has quit smoking. His smoking use included Cigarettes and Cigars. He smoked 0.00 packs per day. He has never used smokeless tobacco. He reports that he drinks about 3.6 ounces of alcohol per week. He reports that he does not use illicit drugs.  Allergies: No Known Allergies  Medications:  Scheduled: . enoxaparin (LOVENOX) injection  40 mg Subcutaneous Q24H  . famotidine (PEPCID) IV  20 mg Intravenous Q24H   Continuous: . dextrose 5 % and 0.9 % NaCl with KCl 20 mEq/L 125 mL/hr at 05/31/13 1654    Results for orders placed during the  hospital encounter of 05/24/13 (from the past 24 hour(s))  CBC     Status: Abnormal   Collection Time    06/01/13  5:18 AM      Result Value Ref Range   WBC 6.5  4.0 - 10.5 K/uL   RBC 3.76 (*) 4.22 - 5.81 MIL/uL   Hemoglobin 11.5 (*) 13.0 - 17.0 g/dL   HCT 50.9 (*) 32.6 - 71.2 %   MCV 87.5  78.0 - 100.0 fL   MCH 30.6  26.0 - 34.0 pg   MCHC 35.0  30.0 - 36.0 g/dL   RDW 45.8  09.9 - 83.3 %   Platelets 251  150 - 400 K/uL  BASIC METABOLIC PANEL     Status: Abnormal   Collection Time    06/01/13  5:18 AM      Result Value Ref Range   Sodium 140  137 - 147 mEq/L   Potassium 3.6 (*) 3.7 - 5.3 mEq/L   Chloride 102  96 - 112 mEq/L   CO2 26  19 - 32 mEq/L   Glucose, Bld 104 (*) 70 - 99 mg/dL   BUN 4 (*) 6 - 23 mg/dL   Creatinine, Ser 8.25  0.50 - 1.35 mg/dL   Calcium 8.4  8.4 - 05.3 mg/dL   GFR calc non Af Amer >90  >90 mL/min   GFR  calc Af Amer >90  >90 mL/min  CBC     Status: Abnormal   Collection Time    06/01/13 10:35 AM      Result Value Ref Range   WBC 6.1  4.0 - 10.5 K/uL   RBC 3.95 (*) 4.22 - 5.81 MIL/uL   Hemoglobin 12.1 (*) 13.0 - 17.0 g/dL   HCT 93.7 (*) 16.9 - 67.8 %   MCV 86.8  78.0 - 100.0 fL   MCH 30.6  26.0 - 34.0 pg   MCHC 35.3  30.0 - 36.0 g/dL   RDW 93.8  10.1 - 75.1 %   Platelets 274  150 - 400 K/uL     Dg Abd 2 Views  05/31/2013   CLINICAL DATA:  Ileus  EXAM: ABDOMEN - 2 VIEW  COMPARISON:  May 29, 2013  FINDINGS: Supine and upright images were obtained. There are loops of dilated small bowel with borderline thickened walls. There are multiple air-fluid levels. Stomach is distended with air. There is no appreciable distal air seen, and there does not appear to a been appreciable progression of air compared to recent prior study. Indeed, less air is seen currently in compared to 2 days prior. No free air is appreciable. There are skin staples in the midline of the lower abdomen and pelvis.  IMPRESSION: Bowel gas pattern is consistent with either persistent ileus  or a degree of obstruction. There is slightly less air currently compared to 2 days prior. This finding heightens concern for a degree of bowel obstruction.   Electronically Signed   By: Bretta Bang M.D.   On: 05/31/2013 09:26    ROS:  As stated above in the HPI otherwise negative.  Blood pressure 130/92, pulse 91, temperature 99.4 F (37.4 C), temperature source Oral, resp. rate 18, height 5\' 11"  (1.803 m), weight 185 lb (83.915 kg), SpO2 93.00%.    PE: Gen: NAD, Alert and Oriented HEENT:  [place]/AT, EOMI Neck: Supple, no LAD Lungs: CTA Bilaterally CV: RRR without M/G/R ABM: Soft, midline incision, +BS Ext: No C/C/E  Assessment/Plan: 1) Post op ileus.   His ileus continues to persist, but I do not have any new recommendations.  I do not have any issues with the current surgical management.  His electrolytes are normal and the patient is ambulating.  I do agree that his prolonged ileus can be as a result of his SBO affecting that loop of bowel.  Plan: 1) Continue with NG tube. 2) Advance diet as tolerated. 3) Continue with ambulation.  Twanda Stakes D 06/01/2013, 3:41 PM

## 2013-06-01 NOTE — Progress Notes (Addendum)
6 Days Post-Op  Subjective: Feeling better, 3-4 liquid BMs. He and his wife remain very nervous about his progress.  Objective: Vital signs in last 24 hours: Temp:  [98.9 F (37.2 C)-99.5 F (37.5 C)] 99.4 F (37.4 C) (04/02 0605) Pulse Rate:  [91-101] 91 (04/02 0605) Resp:  [16-18] 18 (04/02 0605) BP: (126-133)/(88-92) 130/92 mmHg (04/02 0605) SpO2:  [93 %-98 %] 93 % (04/02 0605) Last BM Date: Jun 25, 2013  Intake/Output from previous day: Jun 26, 2022 0701 - 04/02 0700 In: 2625 [I.V.:2625] Out: 2940 [Urine:300; Emesis/NG output:2640] Intake/Output this shift:    General appearance: alert and cooperative Nose: NGT holder adjusted Resp: clear to auscultation bilaterally Cardio: regular rate and rhythm GI: soft, hematoma stable with evolving ecchymosis, incision CDI, some BS Neurologic: Mental status: Alert, oriented, thought content appropriate  Lab Results:   Recent Labs  05/30/13 0547 06/01/13 0518  WBC 4.1 6.5  HGB 11.5* 11.5*  HCT 33.3* 32.9*  PLT 233 251   BMET  Recent Labs  05/30/13 0547 06/01/13 0518  NA 137 140  K 4.0 3.6*  CL 100 102  CO2 27 26  GLUCOSE 107* 104*  BUN 9 4*  CREATININE 0.71 0.67  CALCIUM 8.8 8.4   PT/INR No results found for this basename: LABPROT, INR,  in the last 72 hours ABG No results found for this basename: PHART, PCO2, PO2, HCO3,  in the last 72 hours  Studies/Results: Dg Abd 2 Views  25-Jun-2013   CLINICAL DATA:  Ileus  EXAM: ABDOMEN - 2 VIEW  COMPARISON:  May 29, 2013  FINDINGS: Supine and upright images were obtained. There are loops of dilated small bowel with borderline thickened walls. There are multiple air-fluid levels. Stomach is distended with air. There is no appreciable distal air seen, and there does not appear to a been appreciable progression of air compared to recent prior study. Indeed, less air is seen currently in compared to 2 days prior. No free air is appreciable. There are skin staples in the midline of the  lower abdomen and pelvis.  IMPRESSION: Bowel gas pattern is consistent with either persistent ileus or a degree of obstruction. There is slightly less air currently compared to 2 days prior. This finding heightens concern for a degree of bowel obstruction.   Electronically Signed   By: Bretta Bang M.D.   On: June 25, 2013 09:26    Anti-infectives: Anti-infectives   Start     Dose/Rate Route Frequency Ordered Stop   05/26/13 1400  [MAR Hold]  cefOXitin (MEFOXIN) 1 g in dextrose 5 % 50 mL IVPB     (On MAR Hold since 05/26/13 1348)   1 g 100 mL/hr over 30 Minutes Intravenous  Once 05/26/13 0756 05/26/13 1519      Assessment/Plan: s/p Procedure(s): LAPAROSCOPY DIAGNOSTIC (N/A) EXPLORATORY LAPAROTOMY with Lysis of Adhesion (N/A) 1. POD 6, s/p ex lap with LOA  2. Post op ileus 3. Abdominal hematoma under incision 4. Mild hypokalemia  Plan: 1. Ileus seems to be improving. Not surprising it has been slow due to long segment of partial closed loop SBO. 3-4 BMs. NGT output still high, though, so continue NGT today.  2. Chloraseptic for throat pain 3. Replace K+, Labs in AM I spent 30 minutes speaking with him and his wife in [place] and [place] regarding his progress and the plan of care. They would like an opinion from GI so I will consult Dr. Elnoria Howard.   LOS: 8 days    Jerome Sloan 06/01/2013

## 2013-06-02 LAB — BASIC METABOLIC PANEL
BUN: 5 mg/dL — ABNORMAL LOW (ref 6–23)
CALCIUM: 9.4 mg/dL (ref 8.4–10.5)
CHLORIDE: 101 meq/L (ref 96–112)
CO2: 27 meq/L (ref 19–32)
CREATININE: 0.68 mg/dL (ref 0.50–1.35)
GFR calc non Af Amer: >90 mL/min (ref >90)
Glucose, Bld: 108 mg/dL — ABNORMAL HIGH (ref 70–99)
Potassium: 3.8 mEq/L (ref 3.7–5.3)
SODIUM: 140 meq/L (ref 137–147)

## 2013-06-02 NOTE — Progress Notes (Signed)
7 Days Post-Op  Subjective: Multiple liquid BMs  Objective: Vital signs in last 24 hours: Temp:  [98.7 F (37.1 C)-99 F (37.2 C)] 99 F (37.2 C) (04/03 0551) Pulse Rate:  [84-88] 84 (04/03 0551) Resp:  [17] 17 (04/03 0551) BP: (112-128)/(78-86) 112/78 mmHg (04/03 0551) SpO2:  [99 %] 99 % (04/03 0551) Last BM Date: 06/01/13  Intake/Output from previous day: 04/02 0701 - 04/03 0700 In: 3610 [P.O.:60; I.V.:3250; IV Piggyback:300] Out: 1525 [Emesis/NG output:1525] Intake/Output this shift:    General appearance: alert and cooperative Resp: clear to auscultation bilaterally Cardio: regular rate and rhythm GI: soft, evolving ecchymosis stable, NT, active BS  Lab Results:   Recent Labs  06/01/13 0518 06/01/13 1035  WBC 6.5 6.1  HGB 11.5* 12.1*  HCT 32.9* 34.3*  PLT 251 274   BMET  Recent Labs  06/01/13 0518 06/02/13 0359  NA 140 140  K 3.6* 3.8  CL 102 101  CO2 26 27  GLUCOSE 104* 108*  BUN 4* 5*  CREATININE 0.67 0.68  CALCIUM 8.4 9.4   PT/INR No results found for this basename: LABPROT, INR,  in the last 72 hours ABG No results found for this basename: PHART, PCO2, PO2, HCO3,  in the last 72 hours  Studies/Results: Dg Abd 2 Views  June 26, 2013   CLINICAL DATA:  Ileus  EXAM: ABDOMEN - 2 VIEW  COMPARISON:  May 29, 2013  FINDINGS: Supine and upright images were obtained. There are loops of dilated small bowel with borderline thickened walls. There are multiple air-fluid levels. Stomach is distended with air. There is no appreciable distal air seen, and there does not appear to a been appreciable progression of air compared to recent prior study. Indeed, less air is seen currently in compared to 2 days prior. No free air is appreciable. There are skin staples in the midline of the lower abdomen and pelvis.  IMPRESSION: Bowel gas pattern is consistent with either persistent ileus or a degree of obstruction. There is slightly less air currently compared to 2 days  prior. This finding heightens concern for a degree of bowel obstruction.   Electronically Signed   By: Bretta Bang M.D.   On: Jun 26, 2013 09:26    Anti-infectives: Anti-infectives   Start     Dose/Rate Route Frequency Ordered Stop   05/26/13 1400  [MAR Hold]  cefOXitin (MEFOXIN) 1 g in dextrose 5 % 50 mL IVPB     (On MAR Hold since 05/26/13 1348)   1 g 100 mL/hr over 30 Minutes Intravenous  Once 05/26/13 0756 05/26/13 1519      Assessment/Plan: s/p Procedure(s): LAPAROSCOPY DIAGNOSTIC (N/A) EXPLORATORY LAPAROTOMY with Lysis of Adhesion (N/A) 1. POD 7, s/p ex lap with LOA  2. Post op ileus 3. Abdominal hematoma under incision  Plan: 1.D/C NGT, try clears 2. Decrease IVF  LOS: 9 days    Justo Hengel E 06/02/2013

## 2013-06-02 NOTE — Progress Notes (Signed)
Patient complaining of NG Tube discomfort through the night and removed securement device from nose.  Tape was then used to secure tube, however this was removed as well.  Patient educated regarding the importance of keeping tube in place with some type of securement so that it would not come out. Patient understands that due to continued high NG output, tube would likely have to be re-inserted if it came out.  Refused securement and stated "I would rather just hold the tube in place because it feels more comfortable this way."  Tube pinned to gown.  Placement checked via auscultation multiple times and remains in the stomach.

## 2013-06-03 MED ORDER — OXYCODONE HCL 5 MG PO TABS: 5.0000 mg | ORAL_TABLET | Freq: Four times a day (QID) | ORAL | Status: DC | PRN

## 2013-06-03 NOTE — Progress Notes (Signed)
8 Days Post-Op  Subjective: Multiple BMs, no pain  Objective: Vital signs in last 24 hours: Temp:  [98.3 F (36.8 C)-98.8 F (37.1 C)] 98.6 F (37 C) (04/04 0534) Pulse Rate:  [74-89] 86 (04/04 0534) Resp:  [18] 18 (04/04 0534) BP: (118-126)/(79-86) 126/86 mmHg (04/04 0534) SpO2:  [96 %-99 %] 98 % (04/04 0534) Last BM Date: 06/02/13  Intake/Output from previous day: 04/03 0701 - 04/04 0700 In: 2204.2 [P.O.:360; I.V.:1844.2] Out: -  Intake/Output this shift:    General appearance: alert and cooperative Resp: clear to auscultation bilaterally Cardio: regular rate and rhythm GI: soft, stable ecchymosis, active BS, incision OK  Lab Results:   Recent Labs  06/01/13 0518 06/01/13 1035  WBC 6.5 6.1  HGB 11.5* 12.1*  HCT 32.9* 34.3*  PLT 251 274   BMET  Recent Labs  06/01/13 0518 06/02/13 0359  NA 140 140  K 3.6* 3.8  CL 102 101  CO2 26 27  GLUCOSE 104* 108*  BUN 4* 5*  CREATININE 0.67 0.68  CALCIUM 8.4 9.4   PT/INR No results found for this basename: LABPROT, INR,  in the last 72 hours ABG No results found for this basename: PHART, PCO2, PO2, HCO3,  in the last 72 hours  Studies/Results: No results found.  Anti-infectives: Anti-infectives   Start     Dose/Rate Route Frequency Ordered Stop   05/26/13 1400  [MAR Hold]  cefOXitin (MEFOXIN) 1 g in dextrose 5 % 50 mL IVPB     (On MAR Hold since 05/26/13 1348)   1 g 100 mL/hr over 30 Minutes Intravenous  Once 05/26/13 0756 05/26/13 1519      Assessment/Plan: s/p Procedure(s): LAPAROSCOPY DIAGNOSTIC (N/A) EXPLORATORY LAPAROTOMY with Lysis of Adhesion (N/A) 1. POD 8, s/p ex lap with LOA  2. Post op ileus resolving 3. Abdominal hematoma under incision  Plan: 1.fulls now, reg diet at dinner 2. Decrease IVF 3. Oral pain meds  LOS: 10 days    Jerome Sloan E 06/03/2013

## 2013-06-04 MED ORDER — OXYCODONE HCL 5 MG PO TABS: 5.0000 mg | ORAL_TABLET | Freq: Four times a day (QID) | ORAL | Status: AC | PRN

## 2013-06-04 NOTE — Discharge Summary (Signed)
Physician Discharge Summary  Patient ID: Jerome Sloan MRN: [id number] DOB/AGE: [birth year] 43 y.o.  Admit date: 05/24/2013 Discharge date: 06/04/2013  Admission Diagnoses:Small bowel obstruction  Discharge Diagnoses: Status post lysis of adhesions for small bowel obstruction Active Problems:   Abdominal pain   Discharged Condition: good  Hospital Course: Patient was admitted with small bowel obstruction. It did not resolve after a short period with nasogastric tube decompression and IV fluids. He was taken on 05/26/13 for exploratory laparoscopy converted to laparotomy for lysis of adhesions. He had a prolonged ileus due to the long segment of small bowel involved. He did gradually improve allowing advancement of his diet. He has had multiple bowel movements and is tolerating regular diet. He is having minimal pain and is discharged home in stable condition.  Consults: None  Significant Diagnostic Studies: CT  Treatments: surgery: above  Discharge Exam: Blood pressure 135/81, pulse 105, temperature 98.3 F (36.8 C), temperature source Oral, resp. rate 18, height 5\' 11"  (1.803 m), weight 185 lb (83.915 kg), SpO2 99.00%. General appearance: alert and cooperative Resp: clear to auscultation bilaterally Cardio: regular rate and rhythm GI: soft, ecchymosis evolving, active bowel sounds, wound CDI Neurologic: Mental status: Alert, oriented, thought content appropriate  Disposition: Final discharge disposition not confirmed  Discharge Orders   Future Orders Complete By Expires   Diet - low sodium heart healthy  As directed    Discharge instructions  As directed    Comments:     You may travel. No lifting over 10 pounds for 5 weeks. Followup with your surgeon Dr.Landeros later this week or early next week for staple removal.   Increase activity slowly  As directed    Lifting restrictions  As directed    Comments:     Cannot lift over 10 pounds for 5 weeks   No dressing needed   As directed        Medication List         calcium carbonate 500 MG chewable tablet  Commonly known as:  TUMS - dosed in mg elemental calcium  Chew 3 tablets by mouth once.     HUMIRA 40 MG/0.8ML injection  Generic drug:  adalimumab  Inject 40 mg into the skin once a week. sunday     ibuprofen 200 MG tablet  Commonly known as:  ADVIL,MOTRIN  Take 600 mg by mouth every 6 (six) hours as needed for headache or mild pain.     oxyCODONE 5 MG immediate release tablet  Commonly known as:  Oxy IR/ROXICODONE  Take 1-2 tablets (5-10 mg total) by mouth every 6 (six) hours as needed for severe pain (pain).     TACLONEX ointment  Generic drug:  calcipotriene-betamethasone  Apply 1 application topically daily.     zolpidem 5 MG tablet  Commonly known as:  AMBIEN  Take 5 mg by mouth at bedtime.         Signed E 06/04/2013, 7:52 AM

## 2014-08-21 IMAGING — CT CT ABD-PELV W/ CM
2 of 5 series · 16 of 46 positions shown, 18 images · IV contrast (APPLIED)
Comparison: DG ABD ACUTE W/CHEST dated 05/24/2013

CLINICAL DATA: Pain.

EXAM:
CT ABDOMEN AND PELVIS WITH CONTRAST
TECHNIQUE: Multidetector CT imaging of the abdomen and pelvis was performed
using the standard protocol following bolus administration of
intravenous contrast.
CONTRAST:  80mL OMNIPAQUE IOHEXOL 300 MG/ML  SOLN

[Series 3: abd/ pelvis 5.0 i30f 1 · axial · 0.83mm/px · z∈[-452,-32]mm · 13 of 96 slices shown, 15 images]
[im 6/96  soft-tissue]
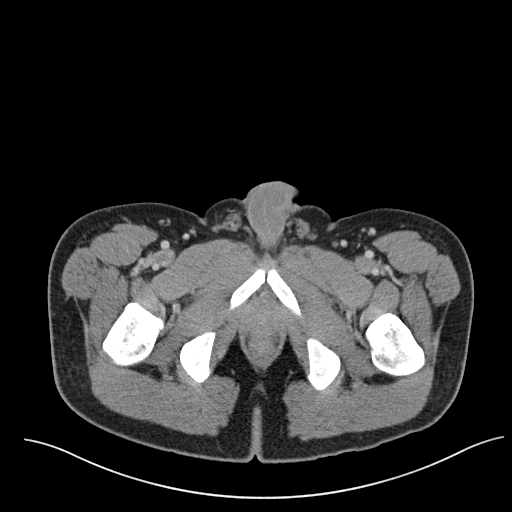
[im 6/96  bone]
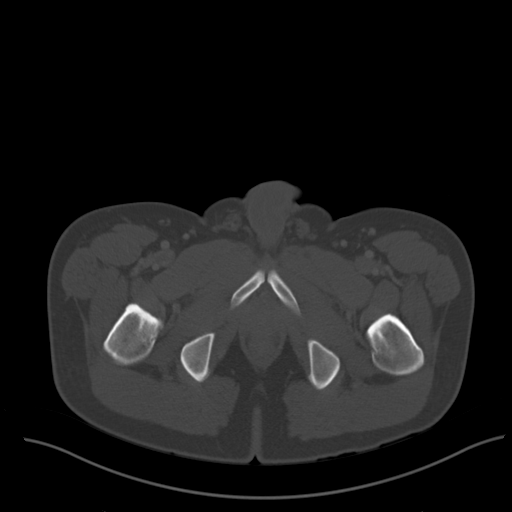
[im 12/96  soft-tissue]
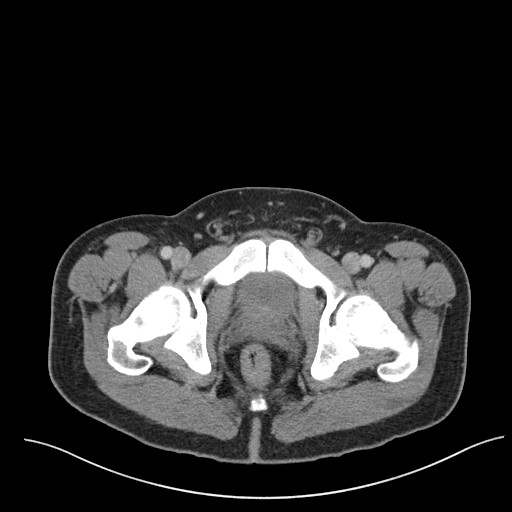
[im 18/96  soft-tissue]
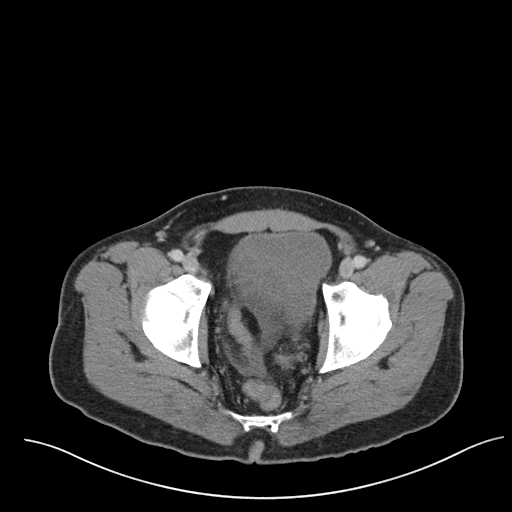
[im 30/96  soft-tissue]
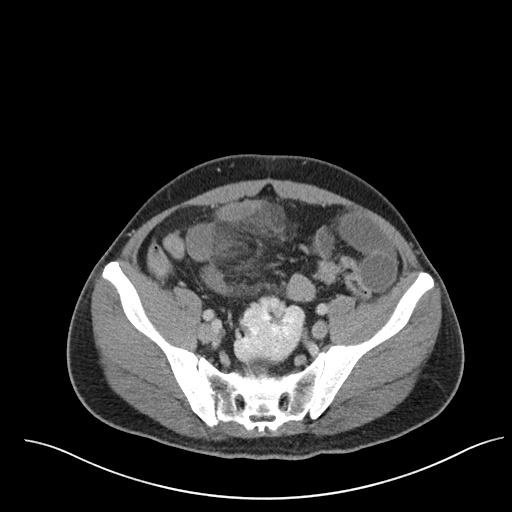
[im 36/96  soft-tissue]
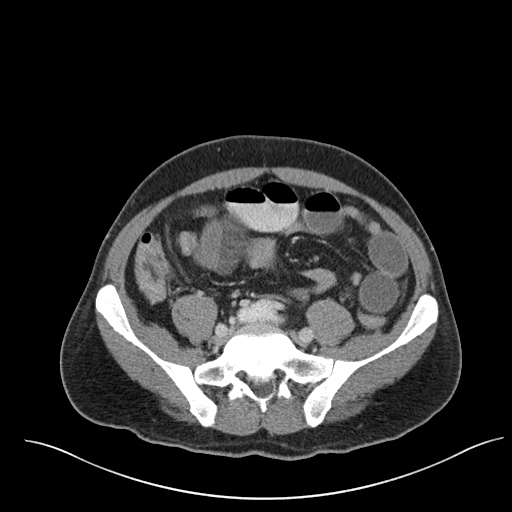
[im 42/96  soft-tissue]
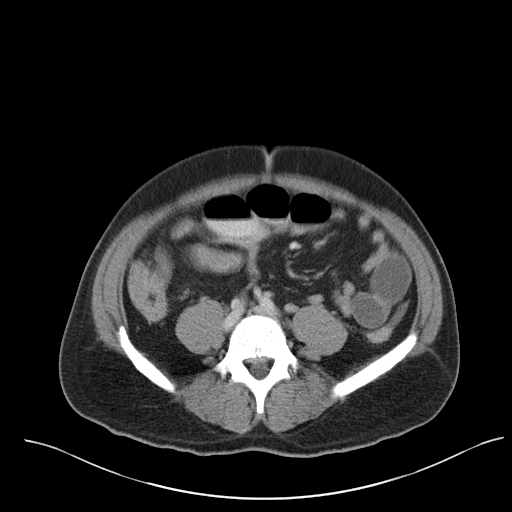
[im 48/96  soft-tissue]
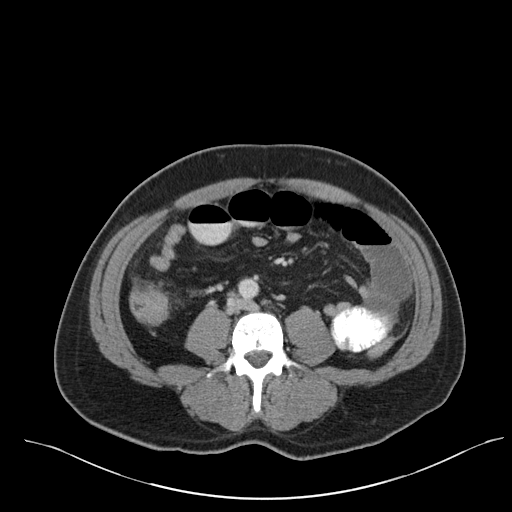
[im 54/96  soft-tissue]
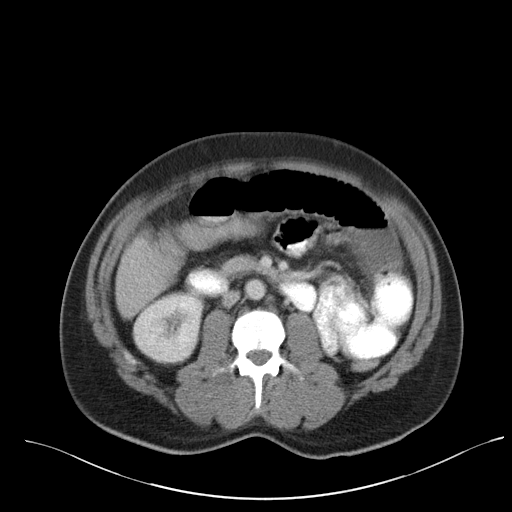
[im 60/96  soft-tissue]
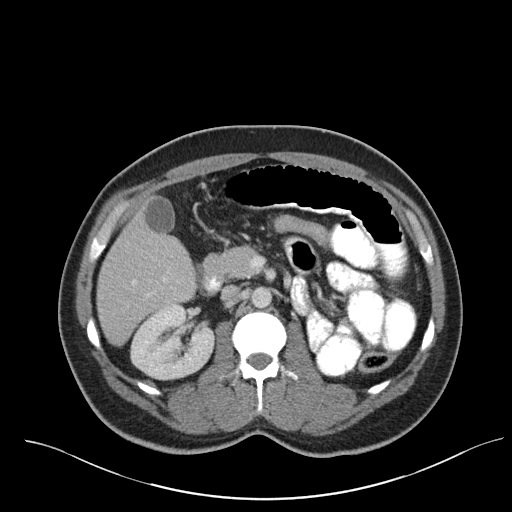
[im 60/96  bone]
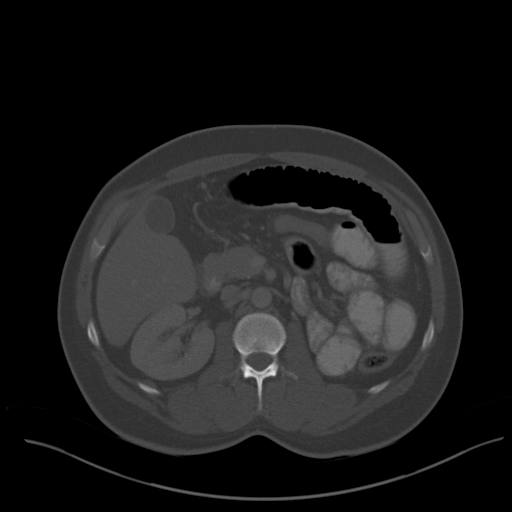
[im 66/96  soft-tissue]
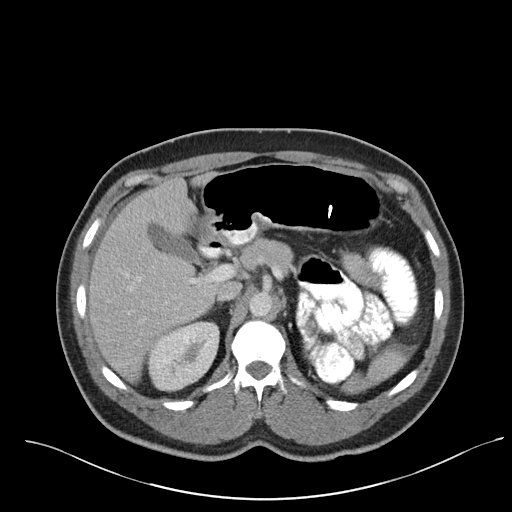
[im 78/96  soft-tissue]
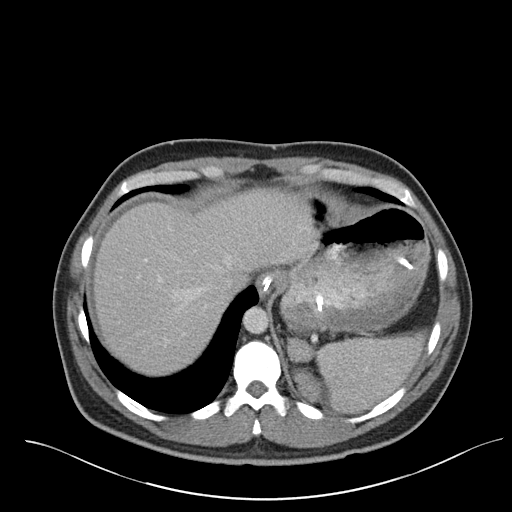
[im 84/96  soft-tissue]
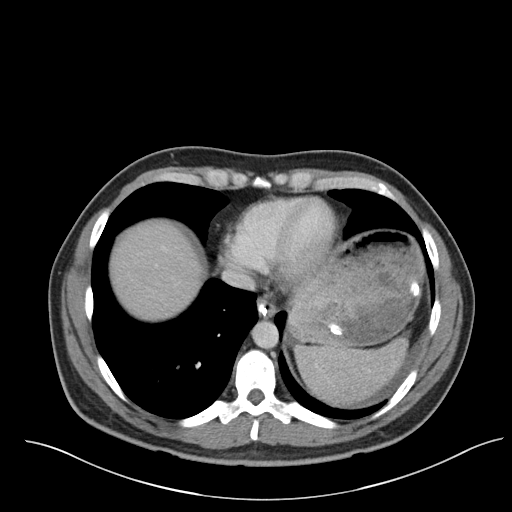
[im 90/96  soft-tissue]
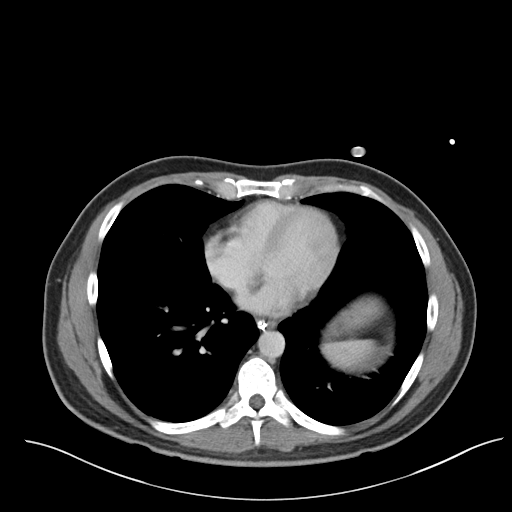

[Series 5: coronals · coronal · 0.70mm/px · 3 of 123 slices shown]
[im 41/123  soft-tissue]
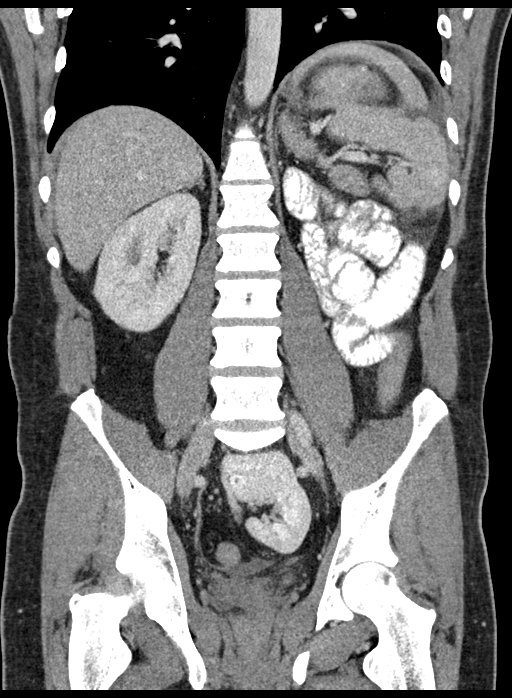
[im 55/123  soft-tissue]
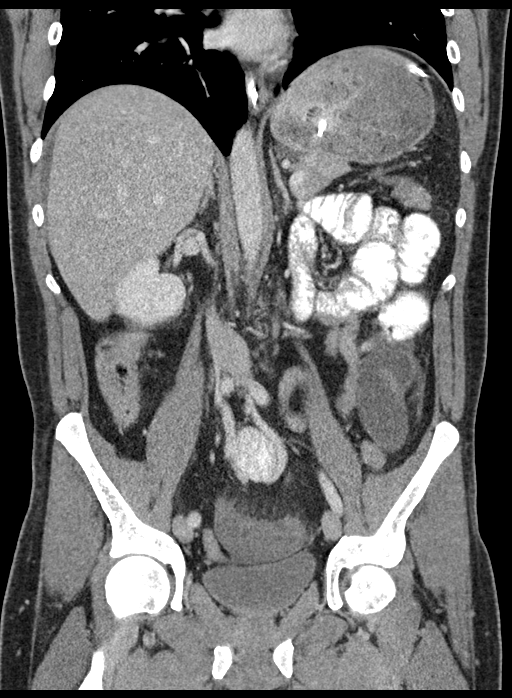
[im 68/123  soft-tissue]
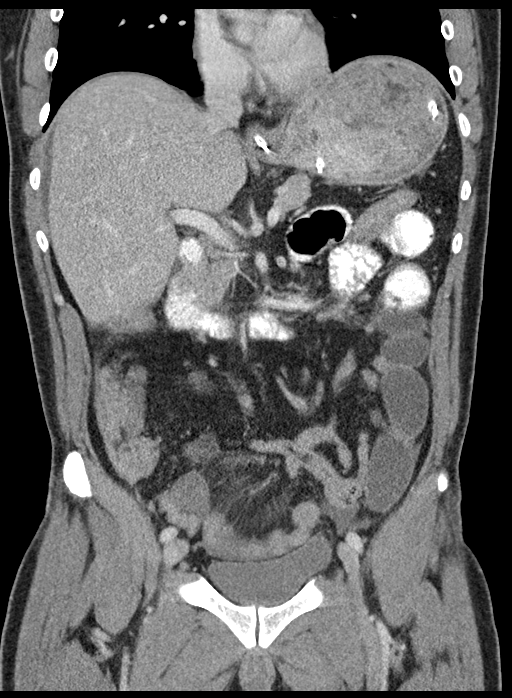

[16 of 46 positions shown; findings below may reference images not displayed]

FINDINGS: Punctate lucency is seen in the right hepatic lobe consistent with
tiny cyst. Liver otherwise normal. Spleen normal. Splenosis.
Pancreas is normal. No biliary distention. Gallbladder is
nondistended.

Adrenals normal. Tiny angiomyolipoma right kidney. Pelvic kidney on
the left. No hydronephrosis or evidence of obstructing ureteral
stone. The bladder is nondistended.

No significant adenopathy. Abdominal aorta normal in caliber.
Visceral vessels are patent. Portal vein patent.

The appendix is not definitely identified, but what appears to be
the appendix is normal. The colon is nondistended. Multiple
distended loops of small bowel are noted. Loops are distended to the
level of the pelvis. Mesenteric edema is present. These findings are
consistent with small bowel obstruction. The etiology of small bowel
obstruction is not determined. NG tube is noted coiled in the
stomach. There is mild gastric distention. No free air. No hernia.

Lung bases clear.  No acute bony abnormality.  Heart size normal.
IMPRESSION: 1. Findings most consistent with small bowel obstruction at the
level of the mid pelvis. Etiology of the obstruction is unclear.
2. NG tube noted coiled in stomach. Debris and fluid noted in the
stomach.
3. Left pelvic kidney.

## 2014-08-23 IMAGING — CR DG ABDOMEN 2V
2 series · 2 of 2 positions shown · non-contrast
Comparison: DG ABD 2 VIEWS dated 05/25/2013

CLINICAL DATA: Small bowel obstruction, abscess placement of
nasogastric tube.

EXAM:
ABDOMEN - 2 VIEW

[w abdomen upright]
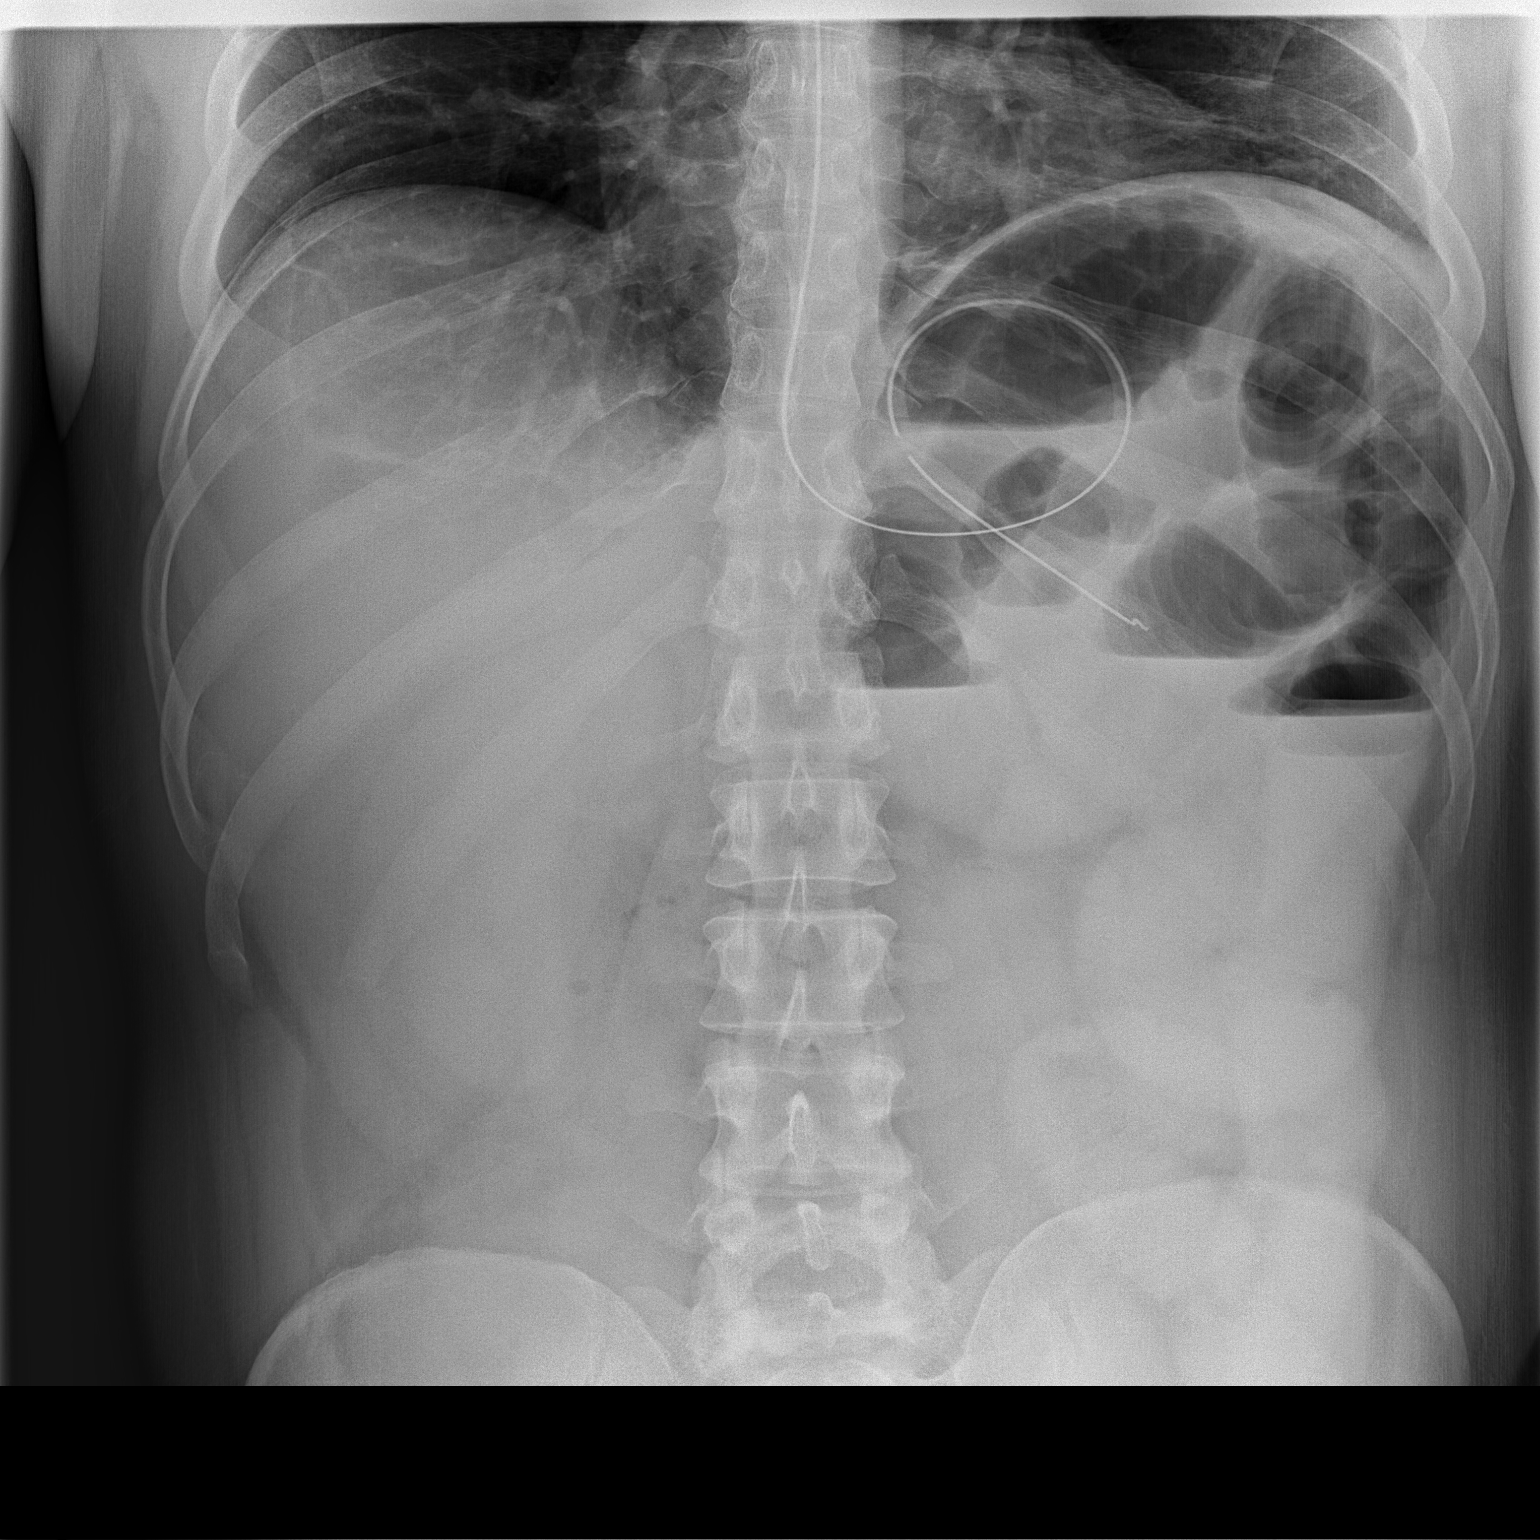

[t abdomen supine]
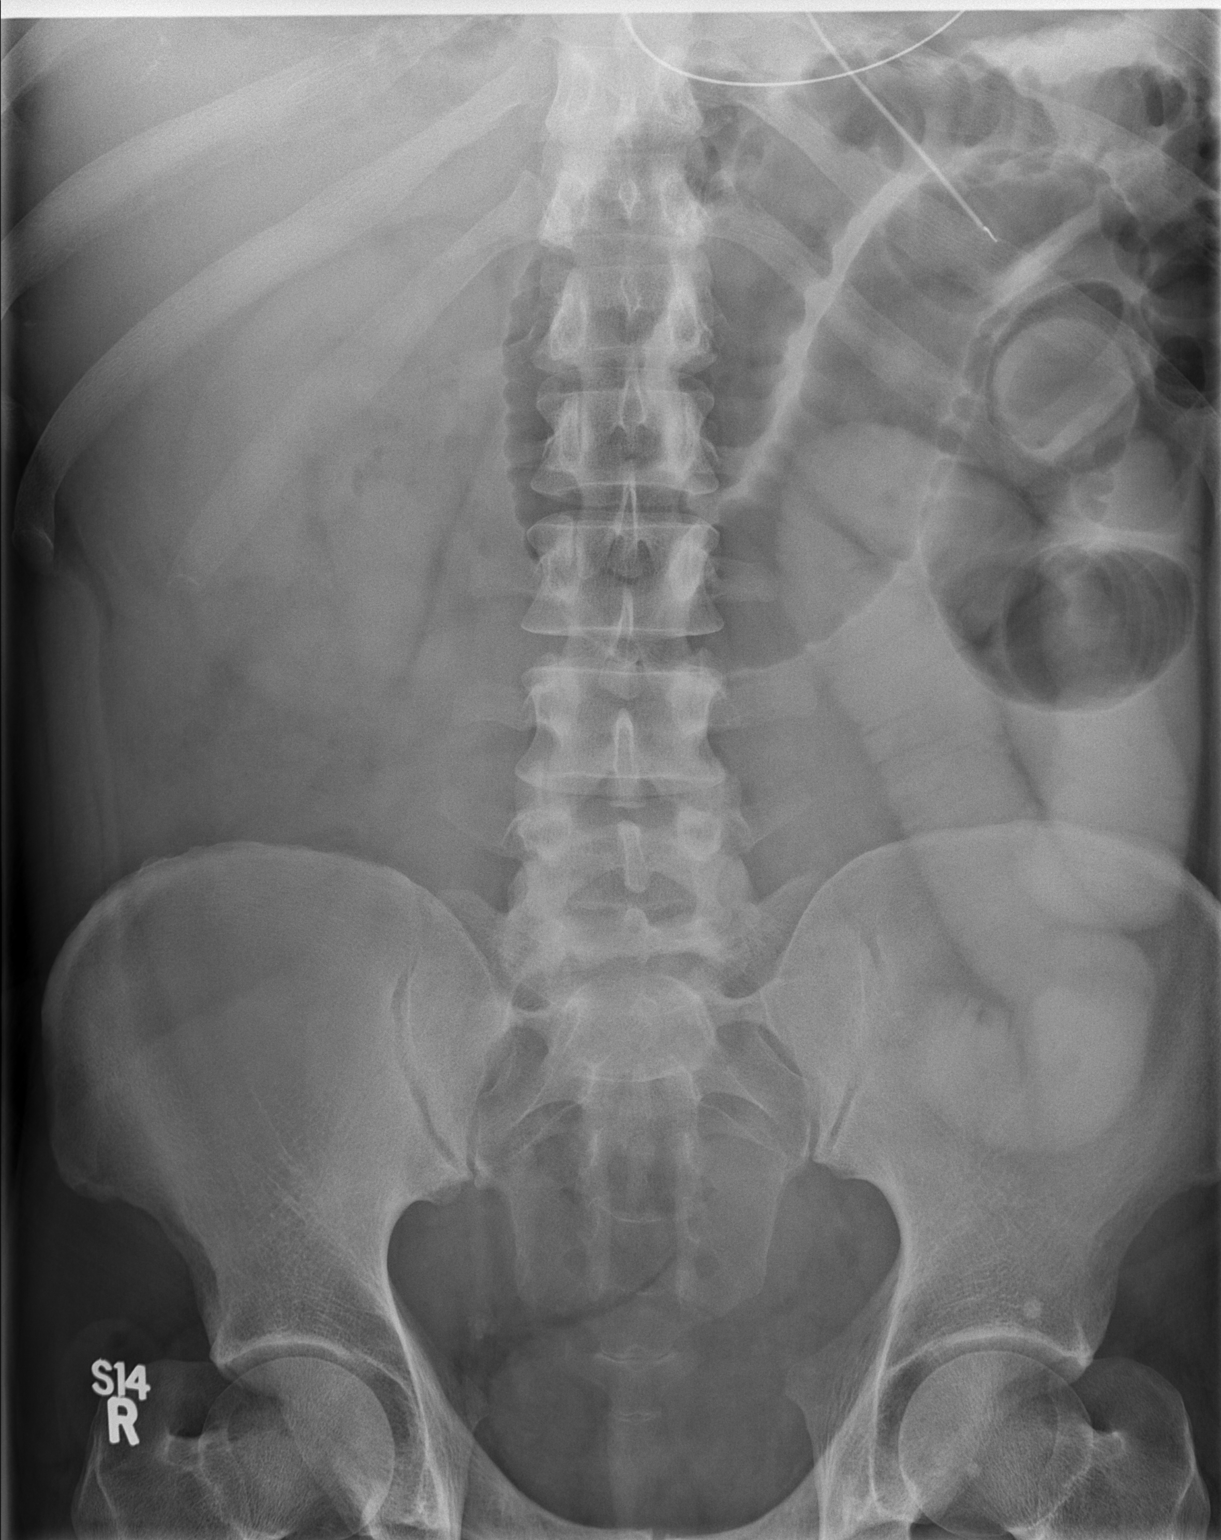

[2 of 2 positions shown; findings below may reference images not displayed]

FINDINGS: There remain loops of mildly distended gas-filled small bowel in the
left upper quadrant of the abdomen. Fluid filled bowel loops are
noted in the left mid and lower abdomen on both views. No free
extraluminal gas collections are demonstrated. There is no
significant colonic gas. The stomach does not appear abnormally
distended. The esophagogastric tube tip and proximal port lie below
the expected level of the GE junction.
IMPRESSION: There has not been significant interval change in the appearance of
the small bowel obstruction.
